# Patient Record
Sex: Female | Born: 1965 | Race: White | Hispanic: No | Smoking: Current every day smoker
Health system: Southern US, Community
[De-identification: ages and names within clinical notes are randomized; demographics above are authoritative.]

## PROBLEM LIST (undated history)

## (undated) DIAGNOSIS — F319 Bipolar disorder, unspecified: Secondary | ICD-10-CM

## (undated) DIAGNOSIS — I10 Essential (primary) hypertension: Secondary | ICD-10-CM

## (undated) DIAGNOSIS — E785 Hyperlipidemia, unspecified: Secondary | ICD-10-CM

## (undated) DIAGNOSIS — G4730 Sleep apnea, unspecified: Secondary | ICD-10-CM

## (undated) DIAGNOSIS — K219 Gastro-esophageal reflux disease without esophagitis: Secondary | ICD-10-CM

## (undated) HISTORY — PX: RECONSTRUCTION OF NOSE: SHX2301

## (undated) HISTORY — DX: Sleep apnea, unspecified: G47.30

## (undated) HISTORY — DX: Gastro-esophageal reflux disease without esophagitis: K21.9

## (undated) HISTORY — DX: Hyperlipidemia, unspecified: E78.5

## (undated) HISTORY — DX: Bipolar disorder, unspecified: F31.9

## (undated) HISTORY — PX: REDUCTION MAMMAPLASTY: SUR839

## (undated) HISTORY — DX: Essential (primary) hypertension: I10

## (undated) HISTORY — PX: BREAST REDUCTION SURGERY: SHX8

---

## 2015-11-27 DIAGNOSIS — F0781 Postconcussional syndrome: Secondary | ICD-10-CM | POA: Insufficient documentation

## 2015-11-27 DIAGNOSIS — F17200 Nicotine dependence, unspecified, uncomplicated: Secondary | ICD-10-CM | POA: Insufficient documentation

## 2015-11-28 DIAGNOSIS — E559 Vitamin D deficiency, unspecified: Secondary | ICD-10-CM | POA: Insufficient documentation

## 2016-01-15 DIAGNOSIS — F3176 Bipolar disorder, in full remission, most recent episode depressed: Secondary | ICD-10-CM | POA: Insufficient documentation

## 2020-10-16 DIAGNOSIS — Z0100 Encounter for examination of eyes and vision without abnormal findings: Secondary | ICD-10-CM | POA: Diagnosis not present

## 2020-10-16 DIAGNOSIS — Z135 Encounter for screening for eye and ear disorders: Secondary | ICD-10-CM | POA: Diagnosis not present

## 2020-10-16 DIAGNOSIS — H5210 Myopia, unspecified eye: Secondary | ICD-10-CM | POA: Diagnosis not present

## 2020-11-19 DIAGNOSIS — H1033 Unspecified acute conjunctivitis, bilateral: Secondary | ICD-10-CM | POA: Diagnosis not present

## 2021-02-19 DIAGNOSIS — Z20822 Contact with and (suspected) exposure to covid-19: Secondary | ICD-10-CM | POA: Diagnosis not present

## 2021-03-05 DIAGNOSIS — R1084 Generalized abdominal pain: Secondary | ICD-10-CM | POA: Diagnosis not present

## 2021-03-05 DIAGNOSIS — K591 Functional diarrhea: Secondary | ICD-10-CM | POA: Diagnosis not present

## 2021-08-12 ENCOUNTER — Other Ambulatory Visit: Payer: Self-pay

## 2021-08-13 ENCOUNTER — Encounter: Payer: Self-pay | Admitting: Family Medicine

## 2021-08-13 ENCOUNTER — Other Ambulatory Visit (HOSPITAL_COMMUNITY)
Admission: RE | Admit: 2021-08-13 | Discharge: 2021-08-13 | Disposition: A | Discharge status: Home / Self Care | Payer: Medicare HMO | Source: Ambulatory Visit | Attending: Diagnostic Radiology | Admitting: Diagnostic Radiology

## 2021-08-13 ENCOUNTER — Ambulatory Visit (INDEPENDENT_AMBULATORY_CARE_PROVIDER_SITE_OTHER): Payer: Medicare HMO | Admitting: Family Medicine

## 2021-08-13 VITALS — BP 132/78 | HR 103 | Temp 97.1°F | Ht 63.25 in | Wt 148.2 lb

## 2021-08-13 DIAGNOSIS — Z01419 Encounter for gynecological examination (general) (routine) without abnormal findings: Secondary | ICD-10-CM | POA: Insufficient documentation

## 2021-08-13 DIAGNOSIS — Z1159 Encounter for screening for other viral diseases: Secondary | ICD-10-CM

## 2021-08-13 DIAGNOSIS — Z1211 Encounter for screening for malignant neoplasm of colon: Secondary | ICD-10-CM | POA: Diagnosis not present

## 2021-08-13 DIAGNOSIS — Z23 Encounter for immunization: Secondary | ICD-10-CM

## 2021-08-13 DIAGNOSIS — E559 Vitamin D deficiency, unspecified: Secondary | ICD-10-CM | POA: Diagnosis not present

## 2021-08-13 DIAGNOSIS — F3176 Bipolar disorder, in full remission, most recent episode depressed: Secondary | ICD-10-CM | POA: Diagnosis not present

## 2021-08-13 DIAGNOSIS — Z124 Encounter for screening for malignant neoplasm of cervix: Secondary | ICD-10-CM | POA: Insufficient documentation

## 2021-08-13 DIAGNOSIS — F17210 Nicotine dependence, cigarettes, uncomplicated: Secondary | ICD-10-CM | POA: Diagnosis not present

## 2021-08-13 DIAGNOSIS — Z114 Encounter for screening for human immunodeficiency virus [HIV]: Secondary | ICD-10-CM

## 2021-08-13 DIAGNOSIS — Z1151 Encounter for screening for human papillomavirus (HPV): Secondary | ICD-10-CM | POA: Diagnosis not present

## 2021-08-13 DIAGNOSIS — Z1231 Encounter for screening mammogram for malignant neoplasm of breast: Secondary | ICD-10-CM

## 2021-08-13 DIAGNOSIS — Z13220 Encounter for screening for lipoid disorders: Secondary | ICD-10-CM

## 2021-08-13 DIAGNOSIS — Z131 Encounter for screening for diabetes mellitus: Secondary | ICD-10-CM

## 2021-08-13 DIAGNOSIS — Z122 Encounter for screening for malignant neoplasm of respiratory organs: Secondary | ICD-10-CM

## 2021-08-13 DIAGNOSIS — R69 Illness, unspecified: Secondary | ICD-10-CM | POA: Diagnosis not present

## 2021-08-13 NOTE — Progress Notes (Signed)
[facility] [address] [place] [address] Dept: [phone] Dept Fax: [phone]  New Patient Office Visit  Subjective:    Patient ID: Ashley [place]ing, female    DOB: April 03, 1966, 56 y.o..   MRN: [id number]  Chief Complaint  Patient presents with   Establish Care    NP- establish care.  No concerns.         History of Present Illness:  Patient is in today to establish care. [place] was born [place]. She attended college at [employer] She moved to [place] about 5 years ago. She has a long-term significant other (20 year relationship). He currently has metastatic rectal cancer. She has a daughter (age 79) from a  previous relationship who lives in [place]. 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 She denies current drug use. She drinks about 5 glasses of wine a week.   Ashley [place]ing started smoking cigarettes in her teens. She quit around age 3, having always smoked less than a pack a day. However, at age 22 she started back smoking and for the past 10 years has been at a pack per day. She has tried to quit at various times in the past, but feels her bipolar disease is her main barrier to quitting.  Ashley [place]ing has a history of Bipolar 1 disease. She is managed by Dr. Toy Care (psychiatry). She is currently on risperidone and sertraline. She notes that she has had multiple concussions in her life. She is also thought to have some overlapping issues with postconcussive syndrome (TBI).  Ashley [place]ing notes she has had past identification of Vitamin D deficiency and hyperlipidemia, though neither is treated currently.  Past Medical History: Patient Active Problem List   Diagnosis Date Noted   Bipolar disorder, in full remission, most recent episode depressed ([place]) 01/15/2016   Vitamin D deficiency 11/28/2015   Postconcussion syndrome 11/27/2015   Nicotine dependence, unspecified,  uncomplicated 51/76/1607   Past Surgical History:  Procedure Laterality Date   BREAST REDUCTION SURGERY Bilateral    RECONSTRUCTION OF NOSE     Family History  Problem Relation Age of Onset   Hyperlipidemia Mother    Hypertension Mother    Stroke Mother    Cancer Father        Skin   Hypertension Father    Hyperlipidemia Father    Stroke Maternal Grandmother    Cancer Maternal Grandfather        Stomach   Heart disease Paternal Grandfather    Outpatient Medications Prior to Visit  Medication Sig Dispense Refill   risperiDONE (RISPERDAL) 1 MG tablet Take 1 tablet by mouth 2 (two) times daily.     sertraline (ZOLOFT) 50 MG tablet Take 50 mg by mouth daily.     No facility-administered medications prior to visit.   No Known Allergies    Objective:   Today's Vitals   08/13/21 1406  BP: 132/78  Pulse: (!) 103  Temp: (!) 97.1 F (36.2 C)  TempSrc: Temporal  SpO2: 96%  Weight: 148 lb 3.2 oz (67.2 kg)  Height: 5' 3.25" (1.607 m)   Body mass index is 26.05 kg/m.   General: Well developed, well nourished. No acute distress. GU: Normal external genitalia. Vaginal vault is pink without discharge. The lower half of the cervix shows   some dilated surface vessels. No CMT. Uterus normal size. No adnexal masses. No anterior or posterior   wall prolapse.  Psych: Alert and oriented x3. Normal mood and affect.  Health Maintenance Due  Topic Date Due   Pneumococcal Vaccine 5-76 Years old (1 - PCV) Never done   Hepatitis C Screening  Never done   TETANUS/TDAP  Never done   PAP SMEAR-Modifier  Never done   COLONOSCOPY (Pts 45-48yrs Insurance coverage will need to be confirmed)  Never done   MAMMOGRAM  Never done   Zoster Vaccines- Shingrix (1 of 2) Never done     Assessment & Plan:   1. Bipolar disorder, in full remission, most recent episode depressed ([place]) Stable on risperidone and  sertraline. She will continue to follow with Dr. Toy Care.  2. Vitamin D deficiency In  light of past history, will reassess Vit. D level.  - VITAMIN D 25 Hydroxy (Vit-D Deficiency, Fractures); Future  3. Cigarette nicotine dependence without complication I recommend smoking cessation. Patient not currently ready to attempt this.  4. Screening for diabetes mellitus (DM)  - Hemoglobin A1c; Future - Basic metabolic panel; Future  5. Screening for lipid disorders  - Lipid panel; Future  6. Encounter for hepatitis C screening test for low risk patient  - HCV Ab w Reflex to Quant PCR; Future  7. Screening for HIV (human immunodeficiency virus)  - HIV Antibody (routine testing w rflx); Future  8. Screening for colon cancer  - Ambulatory referral to Gastroenterology  9. Encounter for screening mammogram for malignant neoplasm of breast  - MM DIGITAL SCREENING BILATERAL; Future  10. Screening for cervical cancer Pap completed today.  - Cytology - PAP  11. Screening for lung cancer Patient has ~ 20 pack-yr. history of smoking. We discussed recommendations for lung cancer screening.  - CT CHEST LUNG CA SCREEN LOW DOSE W/O CM; Future  Haydee Salter, MD

## 2021-08-15 ENCOUNTER — Other Ambulatory Visit (INDEPENDENT_AMBULATORY_CARE_PROVIDER_SITE_OTHER): Payer: Medicare HMO

## 2021-08-15 ENCOUNTER — Other Ambulatory Visit: Payer: Self-pay

## 2021-08-15 ENCOUNTER — Encounter: Payer: Self-pay | Admitting: Family Medicine

## 2021-08-15 DIAGNOSIS — Z131 Encounter for screening for diabetes mellitus: Secondary | ICD-10-CM | POA: Diagnosis not present

## 2021-08-15 DIAGNOSIS — Z1159 Encounter for screening for other viral diseases: Secondary | ICD-10-CM

## 2021-08-15 DIAGNOSIS — Z13220 Encounter for screening for lipoid disorders: Secondary | ICD-10-CM

## 2021-08-15 DIAGNOSIS — R69 Illness, unspecified: Secondary | ICD-10-CM | POA: Diagnosis not present

## 2021-08-15 DIAGNOSIS — E782 Mixed hyperlipidemia: Secondary | ICD-10-CM

## 2021-08-15 DIAGNOSIS — E785 Hyperlipidemia, unspecified: Secondary | ICD-10-CM | POA: Insufficient documentation

## 2021-08-15 DIAGNOSIS — Z114 Encounter for screening for human immunodeficiency virus [HIV]: Secondary | ICD-10-CM

## 2021-08-15 DIAGNOSIS — E559 Vitamin D deficiency, unspecified: Secondary | ICD-10-CM | POA: Diagnosis not present

## 2021-08-15 LAB — VITAMIN D 25 HYDROXY (VIT D DEFICIENCY, FRACTURES): VITD: 17.28 ng/mL — ABNORMAL LOW (ref 30.00–100.00)

## 2021-08-15 LAB — LDL CHOLESTEROL, DIRECT: Direct LDL: 191 mg/dL

## 2021-08-15 LAB — CYTOLOGY - PAP
Adequacy: ABSENT
Comment: NEGATIVE
Diagnosis: NEGATIVE
High risk HPV: NEGATIVE

## 2021-08-15 LAB — BASIC METABOLIC PANEL
BUN: 15 mg/dL (ref 6–23)
CO2: 28 mEq/L (ref 19–32)
Calcium: 9.1 mg/dL (ref 8.4–10.5)
Chloride: 104 mEq/L (ref 96–112)
Creatinine, Ser: 0.85 mg/dL (ref 0.40–1.20)
GFR: 77.18 mL/min (ref >60.00)
Glucose, Bld: 82 mg/dL (ref 70–99)
Potassium: 4.8 mEq/L (ref 3.5–5.1)
Sodium: 140 mEq/L (ref 135–145)

## 2021-08-15 LAB — LIPID PANEL
Cholesterol: 283 mg/dL — ABNORMAL HIGH (ref 0–200)
HDL: 56.4 mg/dL (ref >39.00)
NonHDL: 226.81
Total CHOL/HDL Ratio: 5
Triglycerides: 339 mg/dL — ABNORMAL HIGH (ref 0.0–149.0)
VLDL: 67.8 mg/dL — ABNORMAL HIGH (ref 0.0–40.0)

## 2021-08-15 LAB — HEMOGLOBIN A1C: Hgb A1c MFr Bld: 5.7 % (ref 4.6–6.5)

## 2021-08-16 LAB — HCV INTERPRETATION

## 2021-08-16 LAB — HCV AB W REFLEX TO QUANT PCR: HCV Ab: <0.1 s/co ratio (ref 0.0–0.9)

## 2021-08-18 LAB — HIV ANTIBODY (ROUTINE TESTING W REFLEX): HIV 1&2 Ab, 4th Generation: NONREACTIVE

## 2021-08-18 MED ORDER — ATORVASTATIN CALCIUM 20 MG PO TABS: 20.0000 mg | ORAL_TABLET | Freq: Every day | ORAL | 3 refills | Status: DC

## 2021-08-18 NOTE — Addendum Note (Signed)
Addended by: Haydee Salter on: 08/18/2021 05:18 PM   Modules accepted: Orders

## 2021-08-21 ENCOUNTER — Telehealth: Payer: Self-pay | Admitting: Family Medicine

## 2021-08-21 DIAGNOSIS — E559 Vitamin D deficiency, unspecified: Secondary | ICD-10-CM

## 2021-08-21 MED ORDER — VITAMIN D (ERGOCALCIFEROL) 1.25 MG (50000 UNIT) PO CAPS: 50000.0000 [IU] | ORAL_CAPSULE | ORAL | 1 refills | Status: DC

## 2021-08-21 NOTE — Telephone Encounter (Signed)
Please review and advise. Thanks. Dm/cma  

## 2021-08-21 NOTE — Telephone Encounter (Signed)
error 

## 2021-08-21 NOTE — Telephone Encounter (Signed)
Pt called saying [facility] does not have record of receiving the Rx request for the Vit D. Please resend.

## 2021-08-22 NOTE — Telephone Encounter (Signed)
Lft detailed VM that RX was sent  to pharmacy.  Dm/cma

## 2021-12-18 ENCOUNTER — Encounter: Payer: Self-pay | Admitting: Gastroenterology

## 2021-12-23 ENCOUNTER — Ambulatory Visit
Admission: RE | Admit: 2021-12-23 | Discharge: 2021-12-23 | Disposition: A | Discharge status: Home / Self Care | Payer: Medicare HMO | Source: Ambulatory Visit | Attending: Family Medicine | Admitting: Family Medicine

## 2021-12-23 DIAGNOSIS — Z1231 Encounter for screening mammogram for malignant neoplasm of breast: Secondary | ICD-10-CM

## 2021-12-25 ENCOUNTER — Other Ambulatory Visit: Payer: Self-pay | Admitting: Family Medicine

## 2021-12-25 DIAGNOSIS — R928 Other abnormal and inconclusive findings on diagnostic imaging of breast: Secondary | ICD-10-CM

## 2022-01-09 ENCOUNTER — Ambulatory Visit (AMBULATORY_SURGERY_CENTER): Payer: Medicare HMO | Admitting: *Deleted

## 2022-01-09 VITALS — Ht 62.0 in | Wt 144.0 lb

## 2022-01-09 DIAGNOSIS — Z1211 Encounter for screening for malignant neoplasm of colon: Secondary | ICD-10-CM

## 2022-01-09 NOTE — Progress Notes (Signed)
No egg or soy allergy known to patient  No issues known to pt with past sedation with any surgeries or procedures Patient denies ever being told they had issues or difficulty with intubation  No FH of Malignant Hyperthermia Pt is not on diet pills Pt is not on  home 02  Pt is not on blood thinners  Pt denies issues with constipation  No A fib or A flutter   PV completed in person. Pt verified name, DOB, address and insurance during PV today.  Pt given instruction packet with  consent form to read and sign after procedure and instructions explained and questions answered. Pt encouraged to call with questions or issues.  If pt has My chart, procedure instructions sent via My Chart

## 2022-01-12 ENCOUNTER — Ambulatory Visit
Admission: RE | Admit: 2022-01-12 | Discharge: 2022-01-12 | Disposition: A | Discharge status: Home / Self Care | Payer: Medicare HMO | Source: Ambulatory Visit | Attending: Family Medicine | Admitting: Family Medicine

## 2022-01-12 DIAGNOSIS — F17210 Nicotine dependence, cigarettes, uncomplicated: Secondary | ICD-10-CM | POA: Diagnosis not present

## 2022-01-12 DIAGNOSIS — R69 Illness, unspecified: Secondary | ICD-10-CM | POA: Diagnosis not present

## 2022-01-12 DIAGNOSIS — Z122 Encounter for screening for malignant neoplasm of respiratory organs: Secondary | ICD-10-CM

## 2022-01-14 ENCOUNTER — Encounter: Payer: Self-pay | Admitting: Family Medicine

## 2022-01-14 DIAGNOSIS — I2510 Atherosclerotic heart disease of native coronary artery without angina pectoris: Secondary | ICD-10-CM | POA: Insufficient documentation

## 2022-01-14 DIAGNOSIS — J439 Emphysema, unspecified: Secondary | ICD-10-CM | POA: Insufficient documentation

## 2022-01-14 DIAGNOSIS — I700 Atherosclerosis of aorta: Secondary | ICD-10-CM | POA: Insufficient documentation

## 2022-01-16 ENCOUNTER — Ambulatory Visit: Payer: Medicare HMO

## 2022-01-16 ENCOUNTER — Ambulatory Visit
Admission: RE | Admit: 2022-01-16 | Discharge: 2022-01-16 | Disposition: A | Discharge status: Home / Self Care | Payer: Medicare HMO | Source: Ambulatory Visit | Attending: Family Medicine | Admitting: Family Medicine

## 2022-01-16 DIAGNOSIS — R922 Inconclusive mammogram: Secondary | ICD-10-CM | POA: Diagnosis not present

## 2022-01-16 DIAGNOSIS — R928 Other abnormal and inconclusive findings on diagnostic imaging of breast: Secondary | ICD-10-CM

## 2022-01-29 ENCOUNTER — Encounter: Payer: Self-pay | Admitting: Gastroenterology

## 2022-02-02 DIAGNOSIS — Z0100 Encounter for examination of eyes and vision without abnormal findings: Secondary | ICD-10-CM | POA: Diagnosis not present

## 2022-02-02 DIAGNOSIS — H5210 Myopia, unspecified eye: Secondary | ICD-10-CM | POA: Diagnosis not present

## 2022-02-06 ENCOUNTER — Ambulatory Visit (AMBULATORY_SURGERY_CENTER): Payer: Medicare HMO | Admitting: Gastroenterology

## 2022-02-06 ENCOUNTER — Encounter: Payer: Self-pay | Admitting: Gastroenterology

## 2022-02-06 VITALS — BP 137/77 | HR 78 | Temp 97.3°F | Resp 16 | Ht 63.25 in | Wt 144.0 lb

## 2022-02-06 DIAGNOSIS — K635 Polyp of colon: Secondary | ICD-10-CM

## 2022-02-06 DIAGNOSIS — D123 Benign neoplasm of transverse colon: Secondary | ICD-10-CM | POA: Diagnosis not present

## 2022-02-06 DIAGNOSIS — Z1211 Encounter for screening for malignant neoplasm of colon: Secondary | ICD-10-CM | POA: Diagnosis not present

## 2022-02-06 DIAGNOSIS — D125 Benign neoplasm of sigmoid colon: Secondary | ICD-10-CM

## 2022-02-06 DIAGNOSIS — D122 Benign neoplasm of ascending colon: Secondary | ICD-10-CM

## 2022-02-06 HISTORY — PX: COLONOSCOPY: SHX174

## 2022-02-06 MED ORDER — SODIUM CHLORIDE 0.9 % IV SOLN: 500.0000 mL | Freq: Once | INTRAVENOUS | Status: DC

## 2022-02-06 NOTE — Op Note (Signed)
[facility] Patient Name: Ashley [place] Procedure Date: 02/06/2022 7:16 AM MRN: [id number] Endoscopist: Nicki Reaper E. Candis Schatz , MD Age: 56 Referring MD:  Date of Birth: 06-13-1966 Gender: Female Account #: [id number] Procedure:                Colonoscopy Indications:              Screening for colorectal malignant neoplasm, This                            is the patient's first colonoscopy Medicines:                Monitored Anesthesia Care Procedure:                Pre-Anesthesia Assessment:                           - Prior to the procedure, a History and Physical                            was performed, and patient medications and                            allergies were reviewed. The patient's tolerance of                            previous anesthesia was also reviewed. The risks                            and benefits of the procedure and the sedation                            options and risks were discussed with the patient.                            All questions were answered, and informed consent                            was obtained. Prior Anticoagulants: The patient has                            taken no previous anticoagulant or antiplatelet                            agents. ASA Grade Assessment: II - A patient with                            mild systemic disease. After reviewing the risks                            and benefits, the patient was deemed in                            satisfactory condition to undergo the procedure.  After obtaining informed consent, the colonoscope                            was passed under direct vision. Throughout the                            procedure, the patient's blood pressure, pulse, and                            oxygen saturations were monitored continuously. The                            Olympus CF-HQ190L ([id number]) Colonoscope was                            introduced through the  anus and advanced to the the                            terminal ileum, with identification of the                            appendiceal orifice and IC valve. The colonoscopy                            was performed without difficulty. The patient                            tolerated the procedure well. The quality of the                            bowel preparation was adequate. The terminal ileum,                            ileocecal valve, appendiceal orifice, and rectum                            were photographed. The bowel preparation used was                            Miralax via split dose instruction. Scope In: 7:59:35 AM Scope Out: 8:23:21 AM Scope Withdrawal Time: 0 hours 19 minutes 46 seconds  Total Procedure Duration: 0 hours 23 minutes 46 seconds  Findings:                 The perianal and digital rectal examinations were                            normal.                           A 12 mm polyp was found in the ascending colon. The                            polyp was flat. The polyp was removed with a cold  snare. Resection and retrieval were complete.                            Estimated blood loss was minimal.                           A 6 mm polyp was found in the hepatic flexure. The                            polyp was flat. The polyp was removed with a cold                            snare. Resection and retrieval were complete.                            Estimated blood loss was minimal.                           A 4 mm polyp was found in the sigmoid colon. The                            polyp was sessile. The polyp was removed with a                            cold snare. Resection and retrieval were complete.                            Estimated blood loss was minimal.                           Two sessile polyps were found in the sigmoid colon.                            The polyps were 3 to 6 mm in size. These polyps                             were removed with a cold snare. Resection was                            complete, but only one of the polyps was retrieved.                            Estimated blood loss was minimal.                           Multiple small and large-mouthed diverticula were                            found in the sigmoid colon and descending colon.                           The exam was otherwise normal throughout the  examined colon.                           The terminal ileum appeared normal.                           The retroflexed view of the distal rectum and anal                            verge was normal and showed no anal or rectal                            abnormalities. Complications:            No immediate complications. Estimated Blood Loss:     Estimated blood loss was minimal. Impression:               - One 12 mm polyp in the ascending colon, removed                            with a cold snare. Resected and retrieved.                           - One 6 mm polyp at the hepatic flexure, removed                            with a cold snare. Resected and retrieved.                           - One 4 mm polyp in the sigmoid colon, removed with                            a cold snare. Resected and retrieved.                           - Two 3 to 6 mm polyps in the sigmoid colon,                            removed with a cold snare. Complete resection.                            Partial retrieval (one of the two polyps).                           - Diverticulosis in the sigmoid colon and in the                            descending colon.                           - The examined portion of the ileum was normal.                           - The distal rectum and anal verge are normal on  retroflexion view. Recommendation:           - Patient has a contact number available for                            emergencies. The signs and  symptoms of potential                            delayed complications were discussed with the                            patient. Return to normal activities tomorrow.                            Written discharge instructions were provided to the                            patient.                           - Resume previous diet.                           - Continue present medications.                           - Await pathology results.                           - Repeat colonoscopy (date not yet determined) for                            surveillance based on pathology results. Dickson Kostelnik E. Candis Schatz, MD 02/06/2022 8:33:12 AM This report has been signed electronically.

## 2022-02-06 NOTE — Patient Instructions (Signed)
YOU HAD AN ENDOSCOPIC PROCEDURE TODAY AT THE  ENDOSCOPY CENTER:   Refer to the procedure report that was given to you for any specific questions about what was found during the examination.  If the procedure report does not answer your questions, please call your gastroenterologist to clarify.  If you requested that your care partner not be given the details of your procedure findings, then the procedure report has been included in a sealed envelope for you to review at your convenience later.  YOU SHOULD EXPECT: Some feelings of bloating in the abdomen. Passage of more gas than usual.  Walking can help get rid of the air that was put into your GI tract during the procedure and reduce the bloating. If you had a lower endoscopy (such as a colonoscopy or flexible sigmoidoscopy) you may notice spotting of blood in your stool or on the toilet paper. If you underwent a bowel prep for your procedure, you may not have a normal bowel movement for a few days.  Please Note:  You might notice some irritation and congestion in your nose or some drainage.  This is from the oxygen used during your procedure.  There is no need for concern and it should clear up in a day or so.  SYMPTOMS TO REPORT IMMEDIATELY:  Following lower endoscopy (colonoscopy or flexible sigmoidoscopy):  Excessive amounts of blood in the stool  Significant tenderness or worsening of abdominal pains  Swelling of the abdomen that is new, acute  Fever of 100F or higher  Following upper endoscopy (EGD)  Vomiting of blood or coffee ground material  New chest pain or pain under the shoulder blades  Painful or persistently difficult swallowing  New shortness of breath  Fever of 100F or higher  Black, tarry-looking stools  For urgent or emergent issues, a gastroenterologist can be reached at any hour by calling [phone]. Do not use MyChart messaging for urgent concerns.    DIET:  We do recommend a small meal at first, but  then you may proceed to your regular diet.  Drink plenty of fluids but you should avoid alcoholic beverages for 24 hours.  ACTIVITY:  You should plan to take it easy for the rest of today and you should NOT DRIVE or use heavy machinery until tomorrow (because of the sedation medicines used during the test).    FOLLOW UP: Our staff will call the number listed on your records the next business day following your procedure.  We will call around 7:15- 8:00 am to check on you and address any questions or concerns that you may have regarding the information given to you following your procedure. If we do not reach you, we will leave a message.  If you develop any symptoms (ie: fever, flu-like symptoms, shortness of breath, cough etc.) before then, please call [phone].  If you test positive for Covid 19 in the 2 weeks post procedure, please call and report this information to us.    If any biopsies were taken you will be contacted by phone or by letter within the next 1-3 weeks.  Please call us at [phone] if you have not heard about the biopsies in 3 weeks.    SIGNATURES/CONFIDENTIALITY: You and/or your care partner have signed paperwork which will be entered into your electronic medical record.  These signatures attest to the fact that that the information above on your After Visit Summary has been reviewed and is understood.  Full responsibility of the confidentiality   of this discharge information lies with you and/or your care-partner.  

## 2022-02-06 NOTE — Progress Notes (Signed)
[facility] History and Physical   Primary Care Physician:  Haydee Salter, MD   Reason for Procedure:   Colon cancer screening  Plan:    Screening colonoscopy     HPI: [place] is a 56 y.o. female undergoing initial average risk screening colonoscopy.  She has no family history of colon cancer and no chronic GI symptoms.    Past Medical History:  Diagnosis Date   Bipolar 1 disorder ([place])    GERD (gastroesophageal reflux disease)    Hyperlipidemia    Hypertension     Past Surgical History:  Procedure Laterality Date   BREAST REDUCTION SURGERY Bilateral    COLONOSCOPY  02/06/2022   RECONSTRUCTION OF NOSE     REDUCTION MAMMAPLASTY      Prior to Admission medications   Medication Sig Start Date End Date Taking? Authorizing Provider  atorvastatin (LIPITOR) 20 MG tablet Take 1 tablet (20 mg total) by mouth daily. 08/18/21  Yes Haydee Salter, MD  risperiDONE (RISPERDAL) 1 MG tablet Take 1 tablet by mouth 2 (two) times daily. 03/11/20  Yes [provider]  sertraline (ZOLOFT) 50 MG tablet Take 50 mg by mouth daily. 08/06/21  Yes [provider]  ibuprofen (ADVIL) 800 MG tablet  09/28/21   [provider]    Current Outpatient Medications  Medication Sig Dispense Refill   atorvastatin (LIPITOR) 20 MG tablet Take 1 tablet (20 mg total) by mouth daily. 90 tablet 3   risperiDONE (RISPERDAL) 1 MG tablet Take 1 tablet by mouth 2 (two) times daily.     sertraline (ZOLOFT) 50 MG tablet Take 50 mg by mouth daily.     ibuprofen (ADVIL) 800 MG tablet      Current Facility-Administered Medications  Medication Dose Route Frequency Provider Last Rate Last Admin   0.9 %  sodium chloride infusion  500 mL Intravenous Once Daryel November, MD        Allergies as of 02/06/2022   (No Known Allergies)    Family History  Problem Relation Age of Onset   Hyperlipidemia Mother    Hypertension Mother    Stroke Mother    Cancer Father         Skin   Hypertension Father    Hyperlipidemia Father    Stroke Maternal Grandmother    Stomach cancer Maternal Grandfather    Cancer Maternal Grandfather        Stomach   Heart disease Paternal Grandfather    Colon polyps Neg Hx    Colon cancer Neg Hx    Esophageal cancer Neg Hx    Rectal cancer Neg Hx     Social History   Socioeconomic History   Marital status: Significant Other    Spouse name: Not on file   Number of children: 1   Years of education: Not on file   Highest education level: Not on file  Occupational History   Occupation: Materials engineer    Comment: [employer] [place]  Tobacco Use   Smoking status: Every Day    Packs/day: 1.00    Types: Cigarettes   Smokeless tobacco: Never   Tobacco comments:    Last cigarette around 515 on 02/06/22  Vaping Use   Vaping Use: Never used  Substance and Sexual Activity   Alcohol use: Yes    Comment: wine glass or two on weekends   Drug use: Never   Sexual activity: Yes    Birth control/protection: Post-menopausal  Other Topics Concern  Not on file  Social History Narrative   Not on file   Social Determinants of Health   Financial Resource Strain: Not on file  Food Insecurity: Not on file  Transportation Needs: Not on file  Physical Activity: Not on file  Stress: Not on file  Social Connections: Not on file  Intimate Partner Violence: Not on file    Review of Systems:  All other review of systems negative except as mentioned in the HPI.  Physical Exam: Vital signs BP 136/88   Pulse 96   Temp (!) 97.3 F (36.3 C) (Temporal)   Ht 5' 3.25" (1.607 m)   Wt 144 lb (65.3 kg)   SpO2 96%   BMI 25.31 kg/m   General:   Alert,  Well-developed, well-nourished, pleasant and cooperative in NAD Airway:  Mallampati 2 Lungs:  Clear throughout to auscultation.   Heart:  Regular rate and rhythm; no murmurs, clicks, rubs,  or gallops. Abdomen:  Soft, nontender and nondistended. Normal bowel sounds.   Neuro/Psych:   Normal mood and affect. A and O x 3   Chelesa Weingartner E. Candis Schatz, MD [facility]

## 2022-02-06 NOTE — Progress Notes (Signed)
Sedate, gd SR, tolerated procedure well, VSS, report to RN 

## 2022-02-06 NOTE — Progress Notes (Signed)
Called to room to assist during endoscopic procedure.  Patient ID and intended procedure confirmed with present staff. Received instructions for my participation in the procedure from the performing physician.  

## 2022-02-06 NOTE — Progress Notes (Signed)
Pt's states no medical or surgical changes since previsit or office visit. 

## 2022-02-09 ENCOUNTER — Telehealth: Payer: Self-pay

## 2022-02-09 NOTE — Telephone Encounter (Signed)
No answer, left message to call if having any issues or concerns, B.Inaara Tye RN 

## 2022-02-12 NOTE — Progress Notes (Signed)
Ashley [place],   At least 2 of the polyps that I removed during your recent procedure were completely benign but were proven to be "pre-cancerous" polyps that MAY have grown into cancers if they had not been removed.  One of the polyps was over 10 mm in size.  Studies shows that at least 20% of women over age 56 and 30% of men over age 77 have pre-cancerous polyps.  Based on current nationally recognized surveillance guidelines, I recommend that you have a repeat colonoscopy in 3 years.   If you develop any new rectal bleeding, abdominal pain or significant bowel habit changes, please contact me before then.

## 2022-02-22 ENCOUNTER — Other Ambulatory Visit: Payer: Self-pay | Admitting: Family Medicine

## 2022-02-22 DIAGNOSIS — E559 Vitamin D deficiency, unspecified: Secondary | ICD-10-CM

## 2022-05-05 ENCOUNTER — Encounter: Payer: Self-pay | Admitting: Family Medicine

## 2022-05-05 DIAGNOSIS — B351 Tinea unguium: Secondary | ICD-10-CM

## 2022-05-08 ENCOUNTER — Encounter: Payer: Self-pay | Admitting: Family Medicine

## 2022-05-08 ENCOUNTER — Ambulatory Visit (INDEPENDENT_AMBULATORY_CARE_PROVIDER_SITE_OTHER): Payer: Medicare HMO | Admitting: Family Medicine

## 2022-05-08 VITALS — BP 118/72 | HR 58 | Temp 97.3°F | Ht 63.5 in | Wt 142.4 lb

## 2022-05-08 DIAGNOSIS — Z23 Encounter for immunization: Secondary | ICD-10-CM | POA: Diagnosis not present

## 2022-05-08 DIAGNOSIS — E782 Mixed hyperlipidemia: Secondary | ICD-10-CM | POA: Diagnosis not present

## 2022-05-08 DIAGNOSIS — R69 Illness, unspecified: Secondary | ICD-10-CM | POA: Diagnosis not present

## 2022-05-08 DIAGNOSIS — I2510 Atherosclerotic heart disease of native coronary artery without angina pectoris: Secondary | ICD-10-CM | POA: Diagnosis not present

## 2022-05-08 DIAGNOSIS — F17210 Nicotine dependence, cigarettes, uncomplicated: Secondary | ICD-10-CM

## 2022-05-08 DIAGNOSIS — I700 Atherosclerosis of aorta: Secondary | ICD-10-CM

## 2022-05-08 DIAGNOSIS — M2550 Pain in unspecified joint: Secondary | ICD-10-CM

## 2022-05-08 DIAGNOSIS — J432 Centrilobular emphysema: Secondary | ICD-10-CM

## 2022-05-08 DIAGNOSIS — G8929 Other chronic pain: Secondary | ICD-10-CM

## 2022-05-08 DIAGNOSIS — R519 Headache, unspecified: Secondary | ICD-10-CM

## 2022-05-08 DIAGNOSIS — R12 Heartburn: Secondary | ICD-10-CM

## 2022-05-08 MED ORDER — BUPROPION HCL ER (SR) 150 MG PO TB12: 150.0000 mg | ORAL_TABLET | Freq: Two times a day (BID) | ORAL | 2 refills | Status: DC
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
## 2022-05-12 ENCOUNTER — Other Ambulatory Visit (INDEPENDENT_AMBULATORY_CARE_PROVIDER_SITE_OTHER): Payer: Medicare HMO

## 2022-05-12 DIAGNOSIS — M2550 Pain in unspecified joint: Secondary | ICD-10-CM

## 2022-05-12 DIAGNOSIS — E782 Mixed hyperlipidemia: Secondary | ICD-10-CM | POA: Diagnosis not present

## 2022-05-12 LAB — LIPID PANEL
Cholesterol: 181 mg/dL (ref 0–200)
HDL: 76.9 mg/dL (ref >39.00)
LDL Cholesterol: 66 mg/dL (ref 0–99)
NonHDL: 104.02
Total CHOL/HDL Ratio: 2
Triglycerides: 192 mg/dL — ABNORMAL HIGH (ref 0.0–149.0)
VLDL: 38.4 mg/dL (ref 0.0–40.0)

## 2022-05-12 LAB — C-REACTIVE PROTEIN: CRP: <1 mg/dL (ref 0.5–20.0)

## 2022-05-12 LAB — SEDIMENTATION RATE: Sed Rate: 9 mm/hr (ref 0–30)

## 2022-05-21 ENCOUNTER — Telehealth: Payer: Self-pay | Admitting: Family Medicine

## 2022-05-21 NOTE — Telephone Encounter (Signed)
Left message for patient to call back and schedule Medicare Annual Wellness Visit (AWV).   Please offer to do virtually or by telephone.  Left office number and my jabber [phone].  AWVI eligible as of 01/31/2017  Please schedule at anytime with Nurse Health Advisor.

## 2022-06-11 ENCOUNTER — Other Ambulatory Visit: Payer: Self-pay | Admitting: Family Medicine

## 2022-06-11 DIAGNOSIS — E559 Vitamin D deficiency, unspecified: Secondary | ICD-10-CM

## 2022-06-12 ENCOUNTER — Ambulatory Visit: Payer: Medicare HMO | Admitting: Podiatry

## 2022-06-12 DIAGNOSIS — B351 Tinea unguium: Secondary | ICD-10-CM | POA: Diagnosis not present

## 2022-06-12 MED ORDER — CICLOPIROX 8 % EX SOLN: Freq: Every day | CUTANEOUS | 0 refills | Status: DC

## 2022-06-12 MED ORDER — TERBINAFINE HCL 250 MG PO TABS: 250.0000 mg | ORAL_TABLET | Freq: Every day | ORAL | 2 refills | Status: AC

## 2022-06-12 NOTE — Progress Notes (Signed)
  Subjective:  Patient ID: Ashley Harding, female    DOB: 1965-08-17,  MRN: [id number]  Chief Complaint  Patient presents with   Nail Problem    Nail fungus to bilateral hallux. Has not tried any over the counter medication. Some bleeding when she cuts her nails. Patient is not diabetic.     56 y.o. female presents with concern for bilateral hallux discoloration concern for nail fungal infection.  She is not diabetic has not tried any medications yet for this discoloration.  She says that they are mildly tender at times but not limiting her or bothering her all the time in terms of pain.  Past Medical History:  Diagnosis Date   Bipolar 1 disorder ([place])    GERD (gastroesophageal reflux disease)    Hyperlipidemia    Hypertension     No Known Allergies  ROS: Negative except as per HPI above  Objective:  General: AAO x3, NAD  Dermatological: With attention to the bilateral hallux nails there is thickening yellow-white discoloration and abnormal dystrophic growth.  No significant pain on palpation of either hallux nail.  Remainder of the nails are without significant thickening discoloration or dystrophy.  Vascular:  Dorsalis Pedis artery and Posterior Tibial artery pedal pulses are 2/4 bilateral.  Capillary fill time < 3 sec to all digits.   Neruologic: Grossly intact via light touch bilateral. Protective threshold intact to all sites bilateral.   Musculoskeletal: No gross boney pedal deformities bilateral. No pain, crepitus, or limitation noted with foot and ankle range of motion bilateral. Muscular strength 5/5 in all groups tested bilateral.  Gait: Unassisted, Nonantalgic.   No images are attached to the encounter.  Assessment:   1. Onychomycosis      Plan:  Patient was evaluated and treated and all questions answered.  Onychomycosis -Educated on etiology of nail fungus. -Baseline liver function studies ordered. Will d/c terbinafine if elevated during therapy. -eRx  for oral terbinafine #30. Educated on risks and benefits of the medication. - e Rx for penlac 8% solution applied daily to bilateral hallux nails   Return in about 3 months (around 09/12/2022) for follow up bilateral hallux fungus.          Everitt Amber, DPM [facility]

## 2022-06-13 LAB — HEPATIC FUNCTION PANEL
ALT: 13 IU/L (ref 0–32)
AST: 32 IU/L (ref 0–40)
Albumin: 4.4 g/dL (ref 3.8–4.9)
Alkaline Phosphatase: 97 IU/L (ref 44–121)
Bilirubin Total: 0.5 mg/dL (ref 0.0–1.2)
Bilirubin, Direct: 0.19 mg/dL (ref 0.00–0.40)
Total Protein: 6.9 g/dL (ref 6.0–8.5)

## 2022-07-06 ENCOUNTER — Ambulatory Visit (INDEPENDENT_AMBULATORY_CARE_PROVIDER_SITE_OTHER): Payer: Medicare HMO

## 2022-07-06 VITALS — Ht 62.0 in | Wt 140.0 lb

## 2022-07-06 DIAGNOSIS — Z0000 Encounter for general adult medical examination without abnormal findings: Secondary | ICD-10-CM

## 2022-07-06 NOTE — Patient Instructions (Addendum)
Ashley Harding , Thank you for taking time to come for your Medicare Wellness Visit. I appreciate your ongoing commitment to your health goals. Please review the following plan we discussed and let me know if I can assist you in the future.   These are the goals we discussed:  Goals      Patient Stated     07/06/2022, wants to see ENT for nose and get toes healed        This is a list of the screening recommended for you and due dates:  Health Maintenance  Topic Date Due   Zoster (Shingles) Vaccine (1 of 2) Never done   COVID-19 Vaccine (4 - 2023-24 season) 04/03/2022   Medicare Annual Wellness Visit  07/07/2023   Mammogram  01/17/2024   Pap Smear  08/13/2024   Colon Cancer Screening  02/06/2025   DTaP/Tdap/Td vaccine (2 - Tdap) 08/14/2031   Flu Shot  Completed   Hepatitis C Screening: USPSTF Recommendation to screen - Ages 18-79 yo.  Completed   HIV Screening  Completed   HPV Vaccine  Aged Out    Advanced directives: Advance directive discussed with you today. .  Conditions/risks identified: smoking  Next appointment: Follow up in one year for your annual wellness visit.   Preventive Care 40-64 Years, Female Preventive care refers to lifestyle choices and visits with your health care provider that can promote health and wellness. What does preventive care include? A yearly physical exam. This is also called an annual well check. Dental exams once or twice a year. Routine eye exams. Ask your health care provider how often you should have your eyes checked. Personal lifestyle choices, including: Daily care of your teeth and gums. Regular physical activity. Eating a healthy diet. Avoiding tobacco and drug use. Limiting alcohol use. Practicing safe sex. Taking low-dose aspirin daily starting at age 82. Taking vitamin and mineral supplements as recommended by your health care provider. What happens during an annual well check? The services and screenings done by your health  care provider during your annual well check will depend on your age, overall health, lifestyle risk factors, and family history of disease. Counseling  Your health care provider may ask you questions about your: Alcohol use. Tobacco use. Drug use. Emotional well-being. Home and relationship well-being. Sexual activity. Eating habits. Work and work Statistician. Method of birth control. Menstrual cycle. Pregnancy history. Screening  You may have the following tests or measurements: Height, weight, and BMI. Blood pressure. Lipid and cholesterol levels. These may be checked every 5 years, or more frequently if you are over 12 years old. Skin check. Lung cancer screening. You may have this screening every year starting at age 33 if you have a 30-pack-year history of smoking and currently smoke or have quit within the past 15 years. Fecal occult blood test (FOBT) of the stool. You may have this test every year starting at age 33. Flexible sigmoidoscopy or colonoscopy. You may have a sigmoidoscopy every 5 years or a colonoscopy every 10 years starting at age 42. Hepatitis C blood test. Hepatitis B blood test. Sexually transmitted disease (STD) testing. Diabetes screening. This is done by checking your blood sugar (glucose) after you have not eaten for a while (fasting). You may have this done every 1-3 years. Mammogram. This may be done every 1-2 years. Talk to your health care provider about when you should start having regular mammograms. This may depend on whether you have a family history of breast cancer.  BRCA-related cancer screening. This may be done if you have a family history of breast, ovarian, tubal, or peritoneal cancers. Pelvic exam and Pap test. This may be done every 3 years starting at age 64. Starting at age 18, this may be done every 5 years if you have a Pap test in combination with an HPV test. Bone density scan. This is done to screen for osteoporosis. You may have this  scan if you are at high risk for osteoporosis. Discuss your test results, treatment options, and if necessary, the need for more tests with your health care provider. Vaccines  Your health care provider may recommend certain vaccines, such as: Influenza vaccine. This is recommended every year. Tetanus, diphtheria, and acellular pertussis (Tdap, Td) vaccine. You may need a Td booster every 10 years. Zoster vaccine. You may need this after age 19. Pneumococcal 13-valent conjugate (PCV13) vaccine. You may need this if you have certain conditions and were not previously vaccinated. Pneumococcal polysaccharide (PPSV23) vaccine. You may need one or two doses if you smoke cigarettes or if you have certain conditions. Talk to your health care provider about which screenings and vaccines you need and how often you need them. This information is not intended to replace advice given to you by your health care provider. Make sure you discuss any questions you have with your health care provider. Document Released: 08/16/2015 Document Revised: 04/08/2016 Document Reviewed: 05/21/2015 Elsevier Interactive Patient Education  2017 [employer] Prevention in the Home Falls can cause injuries. They can happen to people of all ages. There are many things you can do to make your home safe and to help prevent falls. What can I do on the outside of my home? Regularly fix the edges of walkways and driveways and fix any cracks. Remove anything that might make you trip as you walk through a door, such as a raised step or threshold. Trim any bushes or trees on the path to your home. Use bright outdoor lighting. Clear any walking paths of anything that might make someone trip, such as rocks or tools. Regularly check to see if handrails are loose or broken. Make sure that both sides of any steps have handrails. Any raised decks and porches should have guardrails on the edges. Have any leaves, snow, or ice  cleared regularly. Use sand or salt on walking paths during winter. Clean up any spills in your garage right away. This includes oil or grease spills. What can I do in the bathroom? Use night lights. Install grab bars by the toilet and in the tub and shower. Do not use towel bars as grab bars. Use non-skid mats or decals in the tub or shower. If you need to sit down in the shower, use a plastic, non-slip stool. Keep the floor dry. Clean up any water that spills on the floor as soon as it happens. Remove soap buildup in the tub or shower regularly. Attach bath mats securely with double-sided non-slip rug tape. Do not have throw rugs and other things on the floor that can make you trip. What can I do in the bedroom? Use night lights. Make sure that you have a light by your bed that is easy to reach. Do not use any sheets or blankets that are too big for your bed. They should not hang down onto the floor. Have a firm chair that has side arms. You can use this for support while you get dressed. Do not have  throw rugs and other things on the floor that can make you trip. What can I do in the kitchen? Clean up any spills right away. Avoid walking on wet floors. Keep items that you use a lot in easy-to-reach places. If you need to reach something above you, use a strong step stool that has a grab bar. Keep electrical cords out of the way. Do not use floor polish or wax that makes floors slippery. If you must use wax, use non-skid floor wax. Do not have throw rugs and other things on the floor that can make you trip. What can I do with my stairs? Do not leave any items on the stairs. Make sure that there are handrails on both sides of the stairs and use them. Fix handrails that are broken or loose. Make sure that handrails are as long as the stairways. Check any carpeting to make sure that it is firmly attached to the stairs. Fix any carpet that is loose or worn. Avoid having throw rugs at the  top or bottom of the stairs. If you do have throw rugs, attach them to the floor with carpet tape. Make sure that you have a light switch at the top of the stairs and the bottom of the stairs. If you do not have them, ask someone to add them for you. What else can I do to help prevent falls? Wear shoes that: Do not have high heels. Have rubber bottoms. Are comfortable and fit you well. Are closed at the toe. Do not wear sandals. If you use a stepladder: Make sure that it is fully opened. Do not climb a closed stepladder. Make sure that both sides of the stepladder are locked into place. Ask someone to hold it for you, if possible. Clearly mark and make sure that you can see: Any grab bars or handrails. First and last steps. Where the edge of each step is. Use tools that help you move around (mobility aids) if they are needed. These include: Canes. Walkers. Scooters. Crutches. Turn on the lights when you go into a dark area. Replace any light bulbs as soon as they burn out. Set up your furniture so you have a clear path. Avoid moving your furniture around. If any of your floors are uneven, fix them. If there are any pets around you, be aware of where they are. Review your medicines with your doctor. Some medicines can make you feel dizzy. This can increase your chance of falling. Ask your doctor what other things that you can do to help prevent falls. This information is not intended to replace advice given to you by your health care provider. Make sure you discuss any questions you have with your health care provider. Document Released: 05/16/2009 Document Revised: 12/26/2015 Document Reviewed: 08/24/2014 Elsevier Interactive Patient Education  2017 [employer].

## 2022-07-06 NOTE — Progress Notes (Signed)
I connected with Ashley Harding today by telephone and verified that I am speaking with the correct person using two identifiers. Location patient: home Location provider: work Persons participating in the virtual visit: Ashley Harding, Ashley Durand LPN.   I discussed the limitations, risks, security and privacy concerns of performing an evaluation and management service by telephone and the availability of in person appointments. I also discussed with the patient that there may be a patient responsible charge related to this service. The patient expressed understanding and verbally consented to this telephonic visit.    Interactive audio and video telecommunications were attempted between this provider and patient, however failed, due to patient having technical difficulties OR patient did not have access to video capability.  We continued and completed visit with audio only.     Vital signs may be patient reported or missing.  Subjective:   Ashley Harding is a 56 y.o. female who presents for an Initial Medicare Annual Wellness Visit.  Review of Systems     Cardiac Risk Factors include: dyslipidemia;smoking/ tobacco exposure     Objective:    Today's Vitals   07/06/22 1256  Weight: 140 lb (63.5 kg)  Height: '5\' 2"'$  (1.575 m)   Body mass index is 25.61 kg/m.     07/06/2022    1:05 PM  Advanced Directives  Does Patient Have a Medical Advance Directive? No    Current Medications (verified) Outpatient Encounter Medications as of 07/06/2022  Medication Sig   atorvastatin (LIPITOR) 20 MG tablet Take 1 tablet (20 mg total) by mouth daily.   ciclopirox (PENLAC) 8 % solution Apply topically at bedtime. Apply over nail and surrounding skin. Apply daily over previous coat. After seven (7) days, may remove with alcohol and continue cycle.   risperiDONE (RISPERDAL) 1 MG tablet Take 1 tablet by mouth 2 (two) times daily.   sertraline (ZOLOFT) 50 MG tablet Take 50 mg by mouth daily.    terbinafine (LAMISIL) 250 MG tablet Take 1 tablet (250 mg total) by mouth daily.   Vitamin D, Ergocalciferol, (DRISDOL) 1.25 MG (50000 UNIT) CAPS capsule TAKE ONE CAPSULE BY MOUTH EVERY WEEK   buPROPion (WELLBUTRIN SR) 150 MG 12 hr tablet Take 1 tablet (150 mg total) by mouth 2 (two) times daily.   No facility-administered encounter medications on file as of 07/06/2022.    Allergies (verified) Patient has no known allergies.   History: Past Medical History:  Diagnosis Date   Bipolar 1 disorder ([place])    GERD (gastroesophageal reflux disease)    Hyperlipidemia    Hypertension    Past Surgical History:  Procedure Laterality Date   BREAST REDUCTION SURGERY Bilateral    COLONOSCOPY  02/06/2022   RECONSTRUCTION OF NOSE     REDUCTION MAMMAPLASTY     Family History  Problem Relation Age of Onset   Hyperlipidemia Mother    Hypertension Mother    Stroke Mother    Cancer Father        Skin   Hypertension Father    Hyperlipidemia Father    Stroke Maternal Grandmother    Stomach cancer Maternal Grandfather    Cancer Maternal Grandfather        Stomach   Heart disease Paternal Grandfather    Colon polyps Neg Hx    Colon cancer Neg Hx    Esophageal cancer Neg Hx    Rectal cancer Neg Hx    Social History   Socioeconomic History   Marital status: Significant Other  Spouse name: Not on file   Number of children: 1   Years of education: Not on file   Highest education level: Not on file  Occupational History   Occupation: Materials engineer    Comment: [employer]- [place]  Tobacco Use   Smoking status: Every Day    Packs/day: 1.00    Types: Cigarettes   Smokeless tobacco: Never   Tobacco comments:    Last cigarette around 515 on 02/06/22  Vaping Use   Vaping Use: Never used  Substance and Sexual Activity   Alcohol use: Yes    Comment: wine glass or two on weekends   Drug use: Never   Sexual activity: Yes    Birth control/protection: Post-menopausal  Other Topics Concern    Not on file  Social History Narrative   Not on file   Social Determinants of Health   Financial Resource Strain: Low Risk  (07/06/2022)   Overall Financial Resource Strain (CARDIA)    Difficulty of Paying Living Expenses: Not hard at all  Food Insecurity: No Food Insecurity (07/06/2022)   Hunger Vital Sign    Worried About Running Out of Food in the Last Year: Never true    Ran Out of Food in the Last Year: Never true  Transportation Needs: No Transportation Needs (07/06/2022)   PRAPARE - Hydrologist (Medical): No    Lack of Transportation (Non-Medical): No  Physical Activity: Sufficiently Active (07/06/2022)   Exercise Vital Sign    Days of Exercise per Week: 4 days    Minutes of Exercise per Session: 150+ min  Stress: Stress Concern Present (07/06/2022)   [place]    Feeling of Stress : To some extent  Social Connections: Not on file    Tobacco Counseling Ready to quit: Yes Counseling given: Not Answered Tobacco comments: Last cigarette around 515 on 02/06/22   Clinical Intake:  Pre-visit preparation completed: Yes  Pain : No/denies pain     Nutritional Status: BMI 25 -29 Overweight Nutritional Risks: None Diabetes: No  How often do you need to have someone help you when you read instructions, pamphlets, or other written materials from your doctor or pharmacy?: 1 - Never  Diabetic? no  Interpreter Needed?: No  Information entered by :: NAllen LPN   Activities of Daily Living    07/06/2022    1:07 PM 07/04/2022   12:15 AM  In your present state of health, do you have any difficulty performing the following activities:  Hearing? 0 0  Vision? 0 1  Difficulty concentrating or making decisions? 0 1  Walking or climbing stairs? 0 0  Dressing or bathing? 0 0  Doing errands, shopping? 0 0  Preparing Food and eating ? N N  Using the Toilet? N N  In the past six months,  have you accidently leaked urine? Y Y  Do you have problems with loss of bowel control? N Y  Managing your Medications? N Y  Comment needs reminder sometimes   Managing your Finances? N Y  Housekeeping or managing your Housekeeping? N Y    Patient Care Team: Haydee Salter, MD as PCP - General (Family Medicine) Nevada Crane, MD as Consulting Physician (Psychiatry)  Indicate any recent Medical Services you may have received from other than [facility] providers in the past year (date may be approximate).     Assessment:   This is a routine wellness examination for Chey.  Hearing/Vision screen Vision Screening - Comments:: Regular eye exams, [facility]  Dietary issues and exercise activities discussed: Current Exercise Habits: The patient has a physically strenuous job, but has no regular exercise apart from work.   Goals Addressed             This Visit's Progress    Patient Stated       07/06/2022, wants to see ENT for nose and get toes healed       Depression Screen    07/06/2022    1:06 PM 08/13/2021    2:55 PM  PHQ 2/9 Scores  PHQ - 2 Score 0 3  PHQ- 9 Score  13    Fall Risk    07/06/2022    1:05 PM 07/04/2022   12:15 AM  Fall Risk   Falls in the past year? 0 0  Number falls in past yr: 0 0  Injury with Fall? 0 1  Risk for fall due to : Medication side effect   Follow up Falls prevention discussed;Education provided;Falls evaluation completed     FALL RISK PREVENTION PERTAINING TO THE HOME:  Any stairs in or around the home? No  If so, are there any without handrails?  N/a Home free of loose throw rugs in walkways, pet beds, electrical cords, etc? Yes  Adequate lighting in your home to reduce risk of falls? Yes   ASSISTIVE DEVICES UTILIZED TO PREVENT FALLS:  Life alert? No  Use of a cane, walker or w/c? No  Grab bars in the bathroom? No  Shower chair or bench in shower? No  Elevated toilet seat or a handicapped toilet? No   TIMED UP AND  GO:  Was the test performed? No .       Cognitive Function:        07/06/2022    1:08 PM  6CIT Screen  What Year? 0 points  What month? 0 points  What time? 0 points  Count back from 20 0 points  Months in reverse 0 points  Repeat phrase 0 points  Total Score 0 points    Immunizations Immunization History  Administered Date(s) Administered   Influenza,inj,Quad PF,6+ Mos 05/08/2022   Influenza-Unspecified 06/03/2021   Moderna Covid-19 Vaccine Bivalent Booster 37yr & up 09/10/2020   Moderna Sars-Covid-2 Vaccination 11/19/2019, 12/17/2019   Td 08/13/2021    TDAP status: Up to date  Flu Vaccine status: Up to date  Pneumococcal vaccine status: Up to date  Covid-19 vaccine status: Completed vaccines  Qualifies for Shingles Vaccine? Yes   Zostavax completed No   Shingrix Completed?: No.    Education has been provided regarding the importance of this vaccine. Patient has been advised to call insurance company to determine out of pocket expense if they have not yet received this vaccine. Advised may also receive vaccine at local pharmacy or Health Dept. Verbalized acceptance and understanding.  Screening Tests Health Maintenance  Topic Date Due   Medicare Annual Wellness (AWV)  Never done   Zoster Vaccines- Shingrix (1 of 2) Never done   COVID-19 Vaccine (4 - 2023-24 season) 04/03/2022   MAMMOGRAM  01/17/2024   PAP SMEAR-Modifier  08/13/2024   COLONOSCOPY (Pts 45-470yrInsurance coverage will need to be confirmed)  02/06/2025   DTaP/Tdap/Td (2 - Tdap) 08/14/2031   INFLUENZA VACCINE  Completed   Hepatitis C Screening  Completed   HIV Screening  Completed   HPV VACCINES  Aged Out    Health Maintenance  Health Maintenance Due  Topic Date Due   Medicare Annual Wellness (AWV)  Never done   Zoster Vaccines- Shingrix (1 of 2) Never done   COVID-19 Vaccine (4 - 2023-24 season) 04/03/2022    Colorectal cancer screening: Type of screening: Colonoscopy. Completed  02/06/2022. Repeat every 3 years  Mammogram status: Completed 01/16/2022. Repeat every year  Bone Density status: n/a  Lung Cancer Screening: (Low Dose CT Chest recommended if Age 78-80 years, 30 pack-year currently smoking OR have quit w/in 15years.) does not qualify.   Lung Cancer Screening Referral: no  Additional Screening:  Hepatitis C Screening: does qualify; Completed 08/15/2021  Vision Screening: Recommended annual ophthalmology exams for early detection of glaucoma and other disorders of the eye. Is the patient up to date with their annual eye exam?  Yes  Who is the provider or what is the name of the office in which the patient attends annual eye exams? [facility] If pt is not established with a provider, would they like to be referred to a provider to establish care? No .   Dental Screening: Recommended annual dental exams for proper oral hygiene  Community Resource Referral / Chronic Care Management: CRR required this visit?  No   CCM required this visit?  No      Plan:     I have personally reviewed and noted the following in the patient's chart:   Medical and social history Use of alcohol, tobacco or illicit drugs  Current medications and supplements including opioid prescriptions. Patient is not currently taking opioid prescriptions. Functional ability and status Nutritional status Physical activity Advanced directives List of other physicians Hospitalizations, surgeries, and ER visits in previous 12 months Vitals Screenings to include cognitive, depression, and falls Referrals and appointments  In addition, I have reviewed and discussed with patient certain preventive protocols, quality metrics, and best practice recommendations. A written personalized care plan for preventive services as well as general preventive health recommendations were provided to patient.     Kellie Simmering, LPN   [id number]   Nurse Notes: none  Due to this being a virtual  visit, the after visit summary with patients personalized plan was offered to patient via mail or my-chart.  Patient would like to access on my-chart

## 2022-07-21 DIAGNOSIS — J3489 Other specified disorders of nose and nasal sinuses: Secondary | ICD-10-CM | POA: Diagnosis not present

## 2022-07-21 DIAGNOSIS — J449 Chronic obstructive pulmonary disease, unspecified: Secondary | ICD-10-CM | POA: Diagnosis not present

## 2022-07-21 DIAGNOSIS — K219 Gastro-esophageal reflux disease without esophagitis: Secondary | ICD-10-CM | POA: Diagnosis not present

## 2022-07-21 DIAGNOSIS — R69 Illness, unspecified: Secondary | ICD-10-CM | POA: Diagnosis not present

## 2022-07-21 DIAGNOSIS — J341 Cyst and mucocele of nose and nasal sinus: Secondary | ICD-10-CM | POA: Diagnosis not present

## 2022-07-21 DIAGNOSIS — G8928 Other chronic postprocedural pain: Secondary | ICD-10-CM | POA: Diagnosis not present

## 2022-07-21 DIAGNOSIS — R0982 Postnasal drip: Secondary | ICD-10-CM | POA: Diagnosis not present

## 2022-07-21 DIAGNOSIS — Z87828 Personal history of other (healed) physical injury and trauma: Secondary | ICD-10-CM | POA: Diagnosis not present

## 2022-07-21 DIAGNOSIS — J0100 Acute maxillary sinusitis, unspecified: Secondary | ICD-10-CM | POA: Diagnosis not present

## 2022-07-21 DIAGNOSIS — J342 Deviated nasal septum: Secondary | ICD-10-CM | POA: Diagnosis not present

## 2022-08-28 ENCOUNTER — Other Ambulatory Visit: Payer: Self-pay | Admitting: Family Medicine

## 2022-08-28 ENCOUNTER — Other Ambulatory Visit: Payer: Self-pay

## 2022-08-28 DIAGNOSIS — E559 Vitamin D deficiency, unspecified: Secondary | ICD-10-CM

## 2022-08-28 DIAGNOSIS — E782 Mixed hyperlipidemia: Secondary | ICD-10-CM

## 2022-08-28 MED ORDER — VITAMIN D (ERGOCALCIFEROL) 1.25 MG (50000 UNIT) PO CAPS: 50000.0000 [IU] | ORAL_CAPSULE | ORAL | 0 refills | Status: DC

## 2022-08-31 ENCOUNTER — Other Ambulatory Visit (INDEPENDENT_AMBULATORY_CARE_PROVIDER_SITE_OTHER): Payer: Medicare HMO | Admitting: Podiatry

## 2022-08-31 ENCOUNTER — Other Ambulatory Visit: Payer: Self-pay | Admitting: Podiatry

## 2022-08-31 ENCOUNTER — Telehealth: Payer: Self-pay | Admitting: Podiatry

## 2022-08-31 DIAGNOSIS — B351 Tinea unguium: Secondary | ICD-10-CM

## 2022-08-31 MED ORDER — TERBINAFINE HCL 250 MG PO TABS: 250.0000 mg | ORAL_TABLET | Freq: Every day | ORAL | 0 refills | Status: DC

## 2022-08-31 MED ORDER — CICLOPIROX 8 % EX SOLN: Freq: Every day | CUTANEOUS | 0 refills | Status: DC

## 2022-08-31 NOTE — Progress Notes (Signed)
Refill of penlac sent per pt request

## 2022-08-31 NOTE — Telephone Encounter (Signed)
Pt need refill of medication. She left her medication in [place] while visiting her mom who had a stroke. Pharmacy has sent over refill request and no response.   terbinafine (LAMISIL) 250 MG tablet   Please advise.  [facility] [address] [place], [place]. AT [place]  [address] [place] [address]

## 2022-08-31 NOTE — Progress Notes (Signed)
Refill of lamisil sent per pt request

## 2022-08-31 NOTE — Telephone Encounter (Signed)
Pt requested wrong refill... She is needing ciclopirox (PENLAC) 8 % solution for her toenails.  Please advise.  [facility] [address] [place], [place]. AT [place]

## 2022-09-11 ENCOUNTER — Ambulatory Visit: Payer: Medicare HMO | Admitting: Podiatry

## 2022-09-18 ENCOUNTER — Ambulatory Visit: Payer: Medicare HMO | Admitting: Podiatry

## 2022-09-18 DIAGNOSIS — B351 Tinea unguium: Secondary | ICD-10-CM

## 2022-09-18 NOTE — Progress Notes (Signed)
  Subjective:  Patient ID: Ashley Harding, female    DOB: November 04, 1965,  MRN: [id number]  Chief Complaint  Patient presents with   Follow-up    follow up bilateral hallux fungus.    57 y.o. female presents for follow-up of bilateral hallux nail fungus.  She has been using topical antifungal Penlac 8% solution applied to the hallux nails.  She has seen improvement in the coloration and decreased thickness thinks nails are thinning out and flattening slightly.  Denies any pain.  She is happy with the results thus far.  She was not taking the oral antifungal terbinafine due to concern for side effects.  Past Medical History:  Diagnosis Date   Bipolar 1 disorder ([place])    GERD (gastroesophageal reflux disease)    Hyperlipidemia    Hypertension     No Known Allergies  ROS: Negative except as per HPI above  Objective:  General: AAO x3, NAD  Dermatological: With attention to the bilateral hallux nails there is decreased yellow-white discoloration and nails are slightly thinner than prior.  No pain on palpation.  Vascular:  Dorsalis Pedis artery and Posterior Tibial artery pedal pulses are 2/4 bilateral.  Capillary fill time < 3 sec to all digits.   Neruologic: Grossly intact via light touch bilateral. Protective threshold intact to all sites bilateral.   Musculoskeletal: No gross boney pedal deformities bilateral. No pain, crepitus, or limitation noted with foot and ankle range of motion bilateral. Muscular strength 5/5 in all groups tested bilateral.  Gait: Unassisted, Nonantalgic.   No images are attached to the encounter.  Assessment:   1. Onychomycosis       Plan:  Patient was evaluated and treated and all questions answered.  Onychomycosis -Educated on etiology of nail fungus. -Improved after topical antifungal patient did not want to take the oral antifungal. -Continue with penlac 8% solution applied daily to bilateral hallux nails -Patient will follow-up in 3 months  for recheck of fungal nails.   Return in about 3 months (around 12/17/2022).          Everitt Amber, DPM [facility]

## 2022-09-28 ENCOUNTER — Encounter: Payer: Self-pay | Admitting: Family Medicine

## 2022-09-30 ENCOUNTER — Ambulatory Visit (INDEPENDENT_AMBULATORY_CARE_PROVIDER_SITE_OTHER): Payer: Medicare HMO | Admitting: Family Medicine

## 2022-09-30 ENCOUNTER — Encounter: Payer: Self-pay | Admitting: Family Medicine

## 2022-09-30 VITALS — BP 124/86 | HR 94 | Temp 98.3°F | Ht 62.0 in | Wt 140.2 lb

## 2022-09-30 DIAGNOSIS — I2510 Atherosclerotic heart disease of native coronary artery without angina pectoris: Secondary | ICD-10-CM | POA: Diagnosis not present

## 2022-09-30 DIAGNOSIS — I700 Atherosclerosis of aorta: Secondary | ICD-10-CM

## 2022-09-30 DIAGNOSIS — R69 Illness, unspecified: Secondary | ICD-10-CM | POA: Diagnosis not present

## 2022-09-30 DIAGNOSIS — R053 Chronic cough: Secondary | ICD-10-CM

## 2022-09-30 DIAGNOSIS — J449 Chronic obstructive pulmonary disease, unspecified: Secondary | ICD-10-CM

## 2022-09-30 DIAGNOSIS — E782 Mixed hyperlipidemia: Secondary | ICD-10-CM | POA: Diagnosis not present

## 2022-09-30 DIAGNOSIS — F17200 Nicotine dependence, unspecified, uncomplicated: Secondary | ICD-10-CM

## 2022-09-30 DIAGNOSIS — R079 Chest pain, unspecified: Secondary | ICD-10-CM

## 2022-09-30 NOTE — Assessment & Plan Note (Signed)
The cough is most likely secondary to chronic bronchitis/COPD, although there could be some element of acid reflux involved. I will refer her to pulmonology to assess PFTS and recommend treatment options.

## 2022-09-30 NOTE — Assessment & Plan Note (Signed)
I strongly recommend that Ashley Harding stop smoking. I suggested she talk to her behavioral health provider regarding medications that might not interfere with her psychiatric meds.

## 2022-09-30 NOTE — Assessment & Plan Note (Signed)
Although the current complaint of chest pain is not clearly anginal, Ashley Harding is at very high-risk for cardiac disease based on CT evidence calcifications of her LAD, aortic atherosclerosis, hyperlipidemia, and her ongoing heavy use of tobacco. I will refer her to cardiology for assessment.

## 2022-09-30 NOTE — Progress Notes (Signed)
[facility] [address] [place] [address] Dept: [phone] Dept Fax: [phone]  Office Visit  Subjective:    Patient ID: Ashley Harding, female    DOB: 08/28/1965, 57 y.o..   MRN: [id number]  Chief Complaint  Patient presents with   Medical Management of Chronic Issues    Wants a cardiology referral, c/o having migraines, chest pains, cough off/on.  Has taken Tums and Ibuprofen.    History of Present Illness:  Patient is in today for with concerns about chest pains and requesting to see a cardiologist. She notes that she has been having chest pain. Although she notes this is intermittent, she means that this waxes and wanes, but is never fully gone. She has occasional pain into both sides of her neck. she has not had arm pain, nausea associated with the chest pain, and has had no diaphoresis.  Ashley Harding also has a history of a persistent cough. She notes that she has coughing spells that do make her feel like she might vomit. She feels she may be having acid reflux that set off her coughing. She wonders if she might need to see a GI doctor to evaluate her stomach. She smokes 1 to 1 1/2 ppd of cigarettes. She admits to cough being worse in the mornings. She had tried bupropion, but this apparently interfered with her risperidone that she takes for her bipolar disorder.  Past Medical History: Patient Active Problem List   Diagnosis Date Noted   Laryngopharyngeal reflux (LPR) 07/21/2022   COPD (chronic obstructive pulmonary disease) ([place]) 07/21/2022   Aortic atherosclerosis ([place]) 01/14/2022   Coronary artery calcification seen on CT scan 01/14/2022   Emphysema lung ([place]) 01/14/2022   Hyperlipidemia 08/15/2021   Bipolar disorder, in full remission, most recent episode depressed ([place]) 01/15/2016   Vitamin D deficiency 11/28/2015   Postconcussion syndrome 11/27/2015   Tobacco use disorder 11/27/2015   Past Surgical  History:  Procedure Laterality Date   BREAST REDUCTION SURGERY Bilateral    COLONOSCOPY  02/06/2022   RECONSTRUCTION OF NOSE     REDUCTION MAMMAPLASTY     Family History  Problem Relation Age of Onset   Hyperlipidemia Mother    Hypertension Mother    Stroke Mother    Cancer Father        Skin   Hypertension Father    Hyperlipidemia Father    Stroke Maternal Grandmother    Stomach cancer Maternal Grandfather    Cancer Maternal Grandfather        Stomach   Heart disease Paternal Grandfather    Colon polyps Neg Hx    Colon cancer Neg Hx    Esophageal cancer Neg Hx    Rectal cancer Neg Hx    Outpatient Medications Prior to Visit  Medication Sig Dispense Refill   atorvastatin (LIPITOR) 20 MG tablet TAKE ONE TABLET BY MOUTH ONE TIME DAILY 90 tablet 1   ciclopirox (PENLAC) 8 % solution Apply topically at bedtime. Apply over nail and surrounding skin. Apply daily over previous coat. After seven (7) days, may remove with alcohol and continue cycle. 6.6 mL 0   risperiDONE (RISPERDAL) 1 MG tablet Take 1 tablet by mouth 2 (two) times daily.     sertraline (ZOLOFT) 50 MG tablet Take 50 mg by mouth daily.     Vitamin D, Ergocalciferol, (DRISDOL) 1.25 MG (50000 UNIT) CAPS capsule Take 1 capsule (50,000 Units total) by mouth once a week. 5 capsule 0  ciclopirox (PENLAC) 8 % solution Apply topically at bedtime. Apply over nail and surrounding skin. Apply daily over previous coat. After seven (7) days, may remove with alcohol and continue cycle. 6.6 mL 0   buPROPion (WELLBUTRIN SR) 150 MG 12 hr tablet Take 1 tablet (150 mg total) by mouth 2 (two) times daily. 60 tablet 2   terbinafine (LAMISIL) 250 MG tablet Take 1 tablet (250 mg total) by mouth daily. 30 tablet 0   No facility-administered medications prior to visit.   No Known Allergies   Objective:   Today's Vitals   09/30/22 1322  BP: 124/86  Pulse: 94  Temp: 98.3 F (36.8 C)  TempSrc: Temporal  SpO2: 95%  Weight: 140 lb 3.2 oz  (63.6 kg)  Height: '5\' 2"'$  (1.575 m)   Body mass index is 25.64 kg/m.   General: Well developed, well nourished. No acute distress. Lungs: Clear to auscultation bilaterally. No wheezing, rales or rhonchi. CV: RRR without murmurs or rubs. Pulses 2+ bilaterally. Abdomen: Soft, non-tender. Bowel sounds positive, normal pitch and frequency. No hepatosplenomegaly. No   rebound or guarding. Psych: Alert and oriented. Normal mood and affect.  Health Maintenance Due  Topic Date Due   Zoster Vaccines- Shingrix (1 of 2) Never done   Imaging: CT Lung Cancer Screen (01/13/2023) IMPRESSION: 1. Lung-RADS 1S, negative. Continue annual screening with low-dose chest CT without contrast in 12 months. 2. The "S" modifier above refers to potentially clinically significant non lung cancer related findings. Specifically, there is aortic atherosclerosis, in addition to left circumflex coronary artery disease. Please note that although the presence of coronary artery calcium documents the presence of coronary artery disease, the severity of this disease and any potential stenosis cannot be assessed on this non-gated CT examination. Assessment for potential risk factor modification, dietary therapy or pharmacologic therapy may be warranted, if clinically indicated. 3. Mild diffuse bronchial wall thickening with very mild centrilobular and paraseptal emphysema; imaging findings suggestive of underlying COPD.  Lab Results Last lipids Lab Results  Component Value Date   CHOL 181 05/12/2022   HDL 76.90 05/12/2022   LDLCALC 66 05/12/2022   LDLDIRECT 191.0 08/15/2021   TRIG 192.0 (H) 05/12/2022   CHOLHDL 2 05/12/2022   Assessment & Plan:   Problem List Items Addressed This Visit       Cardiovascular and Mediastinum   Aortic atherosclerosis ([place])   Relevant Orders   Ambulatory referral to Cardiology   Coronary artery calcification seen on CT scan   Relevant Orders   Ambulatory referral to Cardiology      Respiratory   COPD (chronic obstructive pulmonary disease) ([place])   Relevant Orders   Ambulatory referral to Pulmonology     Other   Tobacco use disorder    I strongly recommend that Ashley Harding stop smoking. I suggested she talk to her behavioral health provider regarding medications that might not interfere with her psychiatric meds.      Relevant Orders   Ambulatory referral to Cardiology   Ambulatory referral to Pulmonology   Hyperlipidemia    Last LDL was < 70. Continue atorvastatin 20 mg daily.      Relevant Orders   Ambulatory referral to Cardiology   Chest pain - Primary    Although the current complaint of chest pain is not clearly anginal, Ms. [place] is at very high-risk for cardiac disease based on CT evidence calcifications of her LAD, aortic atherosclerosis, hyperlipidemia, and her ongoing heavy use of tobacco. I will refer her  to cardiology for assessment.      Relevant Orders   Ambulatory referral to Cardiology   Chronic cough    The cough is most likely secondary to chronic bronchitis/COPD, although there could be some element of acid reflux involved. I will refer her to pulmonology to assess PFTS and recommend treatment options.      Relevant Orders   Ambulatory referral to Pulmonology    Return in about 3 months (around 12/29/2022) for Reassessment.   Haydee Salter, MD

## 2022-09-30 NOTE — Assessment & Plan Note (Signed)
Last LDL was < 70. Continue atorvastatin 20 mg daily.

## 2022-10-01 ENCOUNTER — Other Ambulatory Visit: Payer: Self-pay

## 2022-10-01 DIAGNOSIS — E559 Vitamin D deficiency, unspecified: Secondary | ICD-10-CM

## 2022-10-01 NOTE — Telephone Encounter (Signed)
Refill request received from [facility] for:  Vitamin D2 50000 unit LR 08/28/22, #5, 0 rf LOV 09/30/22 FOV   none scheduled.   Please review and advise. Thanks. Dm/cma

## 2022-10-02 ENCOUNTER — Telehealth: Payer: Self-pay | Admitting: Family Medicine

## 2022-10-02 MED ORDER — VITAMIN D (ERGOCALCIFEROL) 1.25 MG (50000 UNIT) PO CAPS: 50000.0000 [IU] | ORAL_CAPSULE | ORAL | 0 refills | Status: DC

## 2022-10-02 NOTE — Telephone Encounter (Signed)
Form  placed on providers desk to be filled out.    Called patient and explained that she would need an appointment to go over the [facility] form. Scheduled for 10/06/22 @ 2:20 pm. Dm/cma

## 2022-10-02 NOTE — Telephone Encounter (Signed)
Patient dropped off document FMLA, to be filled out by provider. Patient requested to send it via Call Patient to pick up within 5-days. Document is located in providers tray at front office.

## 2022-10-06 ENCOUNTER — Ambulatory Visit (INDEPENDENT_AMBULATORY_CARE_PROVIDER_SITE_OTHER): Payer: Medicare HMO | Admitting: Family Medicine

## 2022-10-06 ENCOUNTER — Encounter: Payer: Self-pay | Admitting: Family Medicine

## 2022-10-06 VITALS — BP 120/72 | HR 93 | Temp 98.3°F | Ht 62.0 in | Wt 137.6 lb

## 2022-10-06 DIAGNOSIS — A0472 Enterocolitis due to Clostridium difficile, not specified as recurrent: Secondary | ICD-10-CM

## 2022-10-06 DIAGNOSIS — R079 Chest pain, unspecified: Secondary | ICD-10-CM

## 2022-10-06 DIAGNOSIS — F17200 Nicotine dependence, unspecified, uncomplicated: Secondary | ICD-10-CM | POA: Diagnosis not present

## 2022-10-06 DIAGNOSIS — R69 Illness, unspecified: Secondary | ICD-10-CM | POA: Diagnosis not present

## 2022-10-06 DIAGNOSIS — R197 Diarrhea, unspecified: Secondary | ICD-10-CM | POA: Diagnosis not present

## 2022-10-06 NOTE — Progress Notes (Signed)
[facility] [address] [place] [address] Dept: [phone] Dept Fax: [phone]  Office Visit  Subjective:    Patient ID: Ashley Harding, female    DOB: 1965-08-19, 57 y.o..   MRN: [id number]  Chief Complaint  Patient presents with   Medical Management of Chronic Issues    F/u to discuss FMLA paperwork.  C/o having loss of appetite and more bowel movements (x6) daily   History of Present Illness:  Patient is in today for discussion of FMLA related to her recent chest pain. Ashley Harding was seen recently to discuss with me about chronic left chest pain that was waxing and waning. She had dropped FMLA paperwork. Apparently, she stopped working around 2/22 due to the constant chest pain. She works as a Chief Strategy Officer at [employer]. She notes that any exertion she makes at work, seems to worsen her pain. She is currently scheduled to see the cardiologist on 3/21. She feels like she is unable to work until then. She currently works 3 days a week, < 21 hrs per week.  Ashley Harding also notes that she has had recent diarrhea issues. This has been going on for the past 2 weeks. this morning she had 6 loose to watery stools. she does not recall eating any food that did not taste like it was fresh. She has not had any recent travel.  Past Medical History: Patient Active Problem List   Diagnosis Date Noted   Chest pain 09/30/2022   Chronic cough 09/30/2022   Laryngopharyngeal reflux (LPR) 07/21/2022   COPD (chronic obstructive pulmonary disease) ([place]) 07/21/2022   Aortic atherosclerosis ([place]) 01/14/2022   Coronary artery calcification seen on CT scan 01/14/2022   Emphysema lung ([place]) 01/14/2022   Hyperlipidemia 08/15/2021   Bipolar disorder, in full remission, most recent episode depressed ([place]) 01/15/2016   Vitamin D deficiency 11/28/2015   Postconcussion syndrome 11/27/2015   Tobacco use disorder 11/27/2015   Past Surgical History:   Procedure Laterality Date   BREAST REDUCTION SURGERY Bilateral    COLONOSCOPY  02/06/2022   RECONSTRUCTION OF NOSE     REDUCTION MAMMAPLASTY     Family History  Problem Relation Age of Onset   Hyperlipidemia Mother    Hypertension Mother    Stroke Mother    Cancer Father        Skin   Hypertension Father    Hyperlipidemia Father    Stroke Maternal Grandmother    Stomach cancer Maternal Grandfather    Cancer Maternal Grandfather        Stomach   Heart disease Paternal Grandfather    Colon polyps Neg Hx    Colon cancer Neg Hx    Esophageal cancer Neg Hx    Rectal cancer Neg Hx    Outpatient Medications Prior to Visit  Medication Sig Dispense Refill   atorvastatin (LIPITOR) 20 MG tablet TAKE ONE TABLET BY MOUTH ONE TIME DAILY 90 tablet 1   ciclopirox (PENLAC) 8 % solution Apply topically at bedtime. Apply over nail and surrounding skin. Apply daily over previous coat. After seven (7) days, may remove with alcohol and continue cycle. 6.6 mL 0   risperiDONE (RISPERDAL) 1 MG tablet Take 1 tablet by mouth 2 (two) times daily.     sertraline (ZOLOFT) 50 MG tablet Take 50 mg by mouth daily.     Vitamin D, Ergocalciferol, (DRISDOL) 1.25 MG (50000 UNIT) CAPS capsule Take 1 capsule (50,000 Units total) by mouth once a  week. 5 capsule 0   No facility-administered medications prior to visit.   No Known Allergies   Objective:   Today's Vitals   10/06/22 1412  BP: 120/72  Pulse: 93  Temp: 98.3 F (36.8 C)  TempSrc: Temporal  SpO2: 96%  Weight: 137 lb 9.6 oz (62.4 kg)  Height: '5\' 2"'$  (1.575 m)   Body mass index is 25.17 kg/m.   General: Well developed, well nourished. No acute distress. Psych: Alert and oriented. Normal mood and affect.  Health Maintenance Due  Topic Date Due   Zoster Vaccines- Shingrix (1 of 2) Never done     Assessment & Plan:   Problem List Items Addressed This Visit       Other   Tobacco use disorder    I reinforced recommendation that Ms.  Ashley Harding stop smoking. She states she is not contemplating quitting. I pointed out the impacts to her health and risks for future complications.      Chest pain - Primary    Cardiology assessment is pending. I completed the FMLA paperwork, though it seems premature as her evaluation is not complete.       Other Visit Diagnoses     Diarrhea, unspecified type       Recommend we do some stool studies to check for common etiologies. I will have her use some Imodium for now to control the diarrhea.   Relevant Orders   Stool Culture   Gastrointestinal Panel by PCR , Stool       Return in about 4 weeks (around 11/03/2022) for Reassessment.   Haydee Salter, MD

## 2022-10-06 NOTE — Patient Instructions (Signed)
Recommend Imodium for diarrhea

## 2022-10-06 NOTE — Assessment & Plan Note (Signed)
Cardiology assessment is pending. I completed the FMLA paperwork, though it seems premature as her evaluation is not complete.

## 2022-10-06 NOTE — Assessment & Plan Note (Signed)
I reinforced recommendation that Ashley Harding stop smoking. She states she is not contemplating quitting. I pointed out the impacts to her health and risks for future complications.

## 2022-10-07 DIAGNOSIS — R197 Diarrhea, unspecified: Secondary | ICD-10-CM | POA: Diagnosis not present

## 2022-10-07 NOTE — Addendum Note (Signed)
Addended by: Beryle Lathe S on: 10/07/2022 11:48 AM   Modules accepted: Orders

## 2022-10-07 NOTE — Telephone Encounter (Signed)
Form filled out and faxed to [facility] @ [phone].  Dm/cma

## 2022-10-07 NOTE — Addendum Note (Signed)
Addended by: Beryle Lathe S on: 10/07/2022 11:49 AM   Modules accepted: Orders

## 2022-10-10 LAB — GI PROFILE, STOOL, PCR

## 2022-10-11 ENCOUNTER — Encounter: Payer: Self-pay | Admitting: Family Medicine

## 2022-10-11 LAB — STOOL CULTURE: E coli, Shiga toxin Assay: NEGATIVE

## 2022-10-12 DIAGNOSIS — A0472 Enterocolitis due to Clostridium difficile, not specified as recurrent: Secondary | ICD-10-CM | POA: Insufficient documentation

## 2022-10-12 MED ORDER — VANCOMYCIN HCL 125 MG PO CAPS: 125.0000 mg | ORAL_CAPSULE | Freq: Four times a day (QID) | ORAL | 0 refills | Status: AC

## 2022-10-12 NOTE — Addendum Note (Signed)
Addended by: Haydee Salter on: 10/12/2022 08:08 AM   Modules accepted: Orders

## 2022-10-20 NOTE — Telephone Encounter (Signed)
Spoke to patient and scheduled an appointment for 10/23/22.  Dm/cma

## 2022-10-21 ENCOUNTER — Ambulatory Visit: Payer: Medicare HMO | Admitting: Pulmonary Disease

## 2022-10-21 ENCOUNTER — Encounter: Payer: Self-pay | Admitting: Pulmonary Disease

## 2022-10-21 ENCOUNTER — Ambulatory Visit: Payer: Medicare HMO | Attending: Internal Medicine | Admitting: Internal Medicine

## 2022-10-21 VITALS — BP 122/84 | HR 82 | Ht 62.0 in | Wt 142.0 lb

## 2022-10-21 VITALS — BP 126/74 | HR 101 | Ht 62.0 in | Wt 142.2 lb

## 2022-10-21 DIAGNOSIS — J432 Centrilobular emphysema: Secondary | ICD-10-CM

## 2022-10-21 DIAGNOSIS — R0982 Postnasal drip: Secondary | ICD-10-CM

## 2022-10-21 DIAGNOSIS — R072 Precordial pain: Secondary | ICD-10-CM | POA: Diagnosis not present

## 2022-10-21 DIAGNOSIS — R053 Chronic cough: Secondary | ICD-10-CM

## 2022-10-21 DIAGNOSIS — R0683 Snoring: Secondary | ICD-10-CM | POA: Diagnosis not present

## 2022-10-21 DIAGNOSIS — R69 Illness, unspecified: Secondary | ICD-10-CM | POA: Diagnosis not present

## 2022-10-21 DIAGNOSIS — Z01812 Encounter for preprocedural laboratory examination: Secondary | ICD-10-CM

## 2022-10-21 DIAGNOSIS — F17210 Nicotine dependence, cigarettes, uncomplicated: Secondary | ICD-10-CM

## 2022-10-21 DIAGNOSIS — K219 Gastro-esophageal reflux disease without esophagitis: Secondary | ICD-10-CM

## 2022-10-21 MED ORDER — FLUTICASONE PROPIONATE 50 MCG/ACT NA SUSP: 1.0000 | Freq: Every day | NASAL | 2 refills | Status: AC

## 2022-10-21 MED ORDER — FAMOTIDINE 20 MG PO TABS: 20.0000 mg | ORAL_TABLET | Freq: Every day | ORAL | 2 refills | Status: AC

## 2022-10-21 MED ORDER — FLUTICASONE-SALMETEROL 250-50 MCG/ACT IN AEPB: 1.0000 | INHALATION_SPRAY | Freq: Two times a day (BID) | RESPIRATORY_TRACT | 2 refills | Status: AC

## 2022-10-21 MED ORDER — METOPROLOL TARTRATE 100 MG PO TABS: ORAL_TABLET | ORAL | 0 refills | Status: DC

## 2022-10-21 NOTE — Progress Notes (Signed)
Synopsis: Referred in March 2024 for COPD by Arlester Marker, MD  Subjective:   PATIENT ID: Ashley Harding GENDER: female DOB: 07-20-1966, MRN: [id number]  HPI  Chief Complaint  Patient presents with   Consult    Referred by PCP for history of COPD. Denies any SOB but she has had chest discomfort for the past 2 months. Believes it might be acid reflux. Productive cough with clear phlegm.    Ashley Harding is a 57 year old woman, daily smoker with history of bipolar disorder, GERD, HLD and hypertension who is referred to pulmonary clinic for evaluation of COPD.   She was seen by her PCP 10/06/22, note reviewed, where she complained of chronic left chest pain that waxes/wanes and worsens with exertion while at work. She also has persistent cough which prompted referral to our office. The chest pain is dull. She thought was related to heart burn and has been taking TUMs as needed which has helped. She doesn't think she has shortness of breath. Denies wheezing. She has cough with mucous production. She has sinus congestion and post-nasal drainage. She has seasonal allergies. She will take benedryl as needed. She does not use nasal sprays.   She reports history of snoring. She had a colonoscopy last summer and anesthesiologist was concerned for sleep apnea.   She is enrolled in lung cancer screening, last scan 01/2022 without suspicious nodules. Mild diffuse bronchial wall thickening and very mild centrilobular and paraseptal emphysema. Mild scarring in the medial segment of RML and medial aspect of lingula.   She started smoking in college and then quit at age [age] years. She has been smoking close to 20 years 1-2 packs per day. She works as a Chief Strategy Officer. No family history of lung disease.   Past Medical History:  Diagnosis Date   Bipolar 1 disorder ([place])    GERD (gastroesophageal reflux disease)    Hyperlipidemia    Hypertension      Family History  Problem Relation Age of Onset    Hyperlipidemia Mother    Hypertension Mother    Stroke Mother    Cancer Father        Skin   Hypertension Father    Hyperlipidemia Father    Stroke Maternal Grandmother    Stomach cancer Maternal Grandfather    Cancer Maternal Grandfather        Stomach   Heart disease Paternal Grandfather    Colon polyps Neg Hx    Colon cancer Neg Hx    Esophageal cancer Neg Hx    Rectal cancer Neg Hx      Social History   Socioeconomic History   Marital status: Significant Other    Spouse name: Not on file   Number of children: 1   Years of education: Not on file   Highest education level: Not on file  Occupational History   Occupation: Materials engineer    Comment: [employer] [place]  Tobacco Use   Smoking status: Every Day    Packs/day: 1.5    Types: Cigarettes   Smokeless tobacco: Never  Vaping Use   Vaping Use: Never used  Substance and Sexual Activity   Alcohol use: Yes    Comment: wine glass or two nightly   Drug use: Never   Sexual activity: Yes    Birth control/protection: Post-menopausal  Other Topics Concern   Not on file  Social History Narrative   Not on file   Social Determinants of Health  Financial Resource Strain: Low Risk  (07/06/2022)   Overall Financial Resource Strain (CARDIA)    Difficulty of Paying Living Expenses: Not hard at all  Food Insecurity: No Food Insecurity (07/06/2022)   Hunger Vital Sign    Worried About Running Out of Food in the Last Year: Never true    Ran Out of Food in the Last Year: Never true  Transportation Needs: No Transportation Needs (07/06/2022)   PRAPARE - Hydrologist (Medical): No    Lack of Transportation (Non-Medical): No  Physical Activity: Sufficiently Active (07/06/2022)   Exercise Vital Sign    Days of Exercise per Week: 4 days    Minutes of Exercise per Session: 150+ min  Stress: Stress Concern Present (07/06/2022)   [place]     Feeling of Stress : To some extent  Social Connections: Not on file  Intimate Partner Violence: Not on file     No Known Allergies   Outpatient Medications Prior to Visit  Medication Sig Dispense Refill   atorvastatin (LIPITOR) 20 MG tablet TAKE ONE TABLET BY MOUTH ONE TIME DAILY 90 tablet 1   ciclopirox (PENLAC) 8 % solution Apply topically at bedtime. Apply over nail and surrounding skin. Apply daily over previous coat. After seven (7) days, may remove with alcohol and continue cycle. 6.6 mL 0   risperiDONE (RISPERDAL) 1 MG tablet Take 1 tablet by mouth 2 (two) times daily.     sertraline (ZOLOFT) 50 MG tablet Take 50 mg by mouth daily.     vancomycin (VANCOCIN) 125 MG capsule Take 1 capsule (125 mg total) by mouth 4 (four) times daily for 10 days. 40 capsule 0   Vitamin D, Ergocalciferol, (DRISDOL) 1.25 MG (50000 UNIT) CAPS capsule Take 1 capsule (50,000 Units total) by mouth once a week. 5 capsule 0   No facility-administered medications prior to visit.   Review of Systems  Constitutional:  Negative for chills, fever, malaise/fatigue and weight loss.  HENT:  Positive for congestion. Negative for sinus pain and sore throat.   Eyes: Negative.   Respiratory:  Positive for cough and sputum production. Negative for hemoptysis, shortness of breath and wheezing.   Cardiovascular:  Positive for chest pain. Negative for palpitations, orthopnea, claudication and leg swelling.  Gastrointestinal:  Positive for heartburn. Negative for abdominal pain, nausea and vomiting.  Genitourinary: Negative.   Musculoskeletal:  Negative for joint pain and myalgias.  Skin:  Negative for rash.  Neurological:  Negative for weakness.  Endo/Heme/Allergies:  Positive for environmental allergies.  Psychiatric/Behavioral:  Positive for depression. The patient is nervous/anxious.    Objective:   Vitals:   10/21/22 0926  BP: 122/84  Pulse: 82  SpO2: 97%  Weight: 142 lb (64.4 kg)  Height: 5\' 2"  (1.575 m)    Physical Exam Constitutional:      General: She is not in acute distress.    Appearance: She is not ill-appearing.  HENT:     Head: Normocephalic and atraumatic.  Eyes:     General: No scleral icterus.    Conjunctiva/sclera: Conjunctivae normal.     Pupils: Pupils are equal, round, and reactive to light.  Cardiovascular:     Rate and Rhythm: Normal rate and regular rhythm.     Pulses: Normal pulses.     Heart sounds: Normal heart sounds. No murmur heard. Pulmonary:     Effort: Pulmonary effort is normal.     Breath sounds: Normal  breath sounds. No wheezing, rhonchi or rales.  Abdominal:     General: Bowel sounds are normal.     Palpations: Abdomen is soft.  Musculoskeletal:     Right lower leg: No edema.     Left lower leg: No edema.  Lymphadenopathy:     Cervical: No cervical adenopathy.  Skin:    General: Skin is warm and dry.  Neurological:     General: No focal deficit present.     Mental Status: She is alert.  Psychiatric:        Mood and Affect: Mood normal.        Behavior: Behavior normal.        Thought Content: Thought content normal.        Judgment: Judgment normal.    CBC No results found for: "WBC", "RBC", "HGB", "HCT", "PLT", "MCV", "MCH", "MCHC", "RDW", "LYMPHSABS", "MONOABS", "EOSABS", "BASOSABS"   Chest imaging: CT Chest LCS 01/12/22 Mediastinum/Nodes: No pathologically enlarged mediastinal or hilar lymph nodes. Please note that accurate exclusion of hilar adenopathy is limited on noncontrast CT scans. Esophagus is unremarkable in appearance. No axillary lymphadenopathy.   Lungs/Pleura: No suspicious appearing pulmonary nodules or masses are noted. No acute consolidative airspace disease. No pleural effusions. Mild diffuse bronchial wall thickening with very mild centrilobular and paraseptal emphysema. Mild scarring noted in the medial segment of the right middle lobe and medial aspect of the lingula.   PFT:     No data to display           Labs:  Path:  Echo:  Heart Catheterization:    Assessment & Plan:   Centrilobular emphysema ([place]) - Plan: fluticasone-salmeterol (ADVAIR) 250-50 MCG/ACT AEPB, Pulmonary Function Test, Home sleep test  Cigarette smoker  Chronic cough  Post-nasal drainage - Plan: fluticasone (FLONASE) 50 MCG/ACT nasal spray  Gastroesophageal reflux disease without esophagitis - Plan: famotidine (PEPCID) 20 MG tablet  Snoring - Plan: Home sleep test  Discussion: Ashley Harding is a 57 year old woman, daily smoker with history of bipolar disorder, GERD, HLD and hypertension who is referred to pulmonary clinic for evaluation of COPD.   She has mild centrilobular emphysema on her CT chest scan along with cough and sputum production. Unsure if her chest discomfort is related to obstructive lung disease vs GERD vs cardiac etiology.   Will trial her on advair 250-38mcg 1 puff twice daily and monitor for improvement in the cough.  She is to start famotidine 20mg  daily for GERD.  She is to start fluticasone nasal spray for post-nasal drainage.  She has cardiac evaluation this afternoon, will follow up their recommendations/studies.  Recommend nicotine replacement therapy with 21mg  nicotine patch per day and mini nicotine lozenges as needed.  Follow up in 2-3 months with pulmonary function tests.   Freda Jackson, MD [facility] Office: [phone]   Current Outpatient Medications:    atorvastatin (LIPITOR) 20 MG tablet, TAKE ONE TABLET BY MOUTH ONE TIME DAILY, Disp: 90 tablet, Rfl: 1   ciclopirox (PENLAC) 8 % solution, Apply topically at bedtime. Apply over nail and surrounding skin. Apply daily over previous coat. After seven (7) days, may remove with alcohol and continue cycle., Disp: 6.6 mL, Rfl: 0   famotidine (PEPCID) 20 MG tablet, Take 1 tablet (20 mg total) by mouth daily., Disp: 30 tablet, Rfl: 2   fluticasone (FLONASE) 50 MCG/ACT nasal spray, Place 1 spray  into both nostrils daily., Disp: 16 g, Rfl: 2   fluticasone-salmeterol (ADVAIR) 250-50  MCG/ACT AEPB, Inhale 1 puff into the lungs in the morning and at bedtime., Disp: 60 each, Rfl: 2   risperiDONE (RISPERDAL) 1 MG tablet, Take 1 tablet by mouth 2 (two) times daily., Disp: , Rfl:    sertraline (ZOLOFT) 50 MG tablet, Take 50 mg by mouth daily., Disp: , Rfl:    vancomycin (VANCOCIN) 125 MG capsule, Take 1 capsule (125 mg total) by mouth 4 (four) times daily for 10 days., Disp: 40 capsule, Rfl: 0   Vitamin D, Ergocalciferol, (DRISDOL) 1.25 MG (50000 UNIT) CAPS capsule, Take 1 capsule (50,000 Units total) by mouth once a week., Disp: 5 capsule, Rfl: 0

## 2022-10-21 NOTE — Patient Instructions (Addendum)
Start fluticasone-salmeterol inhaler 1 puff twice daily - rinse mouth out after each use  Start pepcid 1 tab daily for heart burn  Start fluticasone nasal spray, 1 spary per nostril for sinus congestion  Recommend nicotine replacement therapy with 21mg  nicotine patch per day and mini nicotine lozenges as needed. The patches and lozenges can be picked up over the counter at the pharmacy or Call 1-800-quit-NOW to get free nicotine replacement and counseling from the state of [place]   Follow up in 2-3 months for visit and breathing tests.

## 2022-10-21 NOTE — Progress Notes (Signed)
Cardiology Office Note:    Date:  10/21/2022   ID:  JAHNIAH HAIDAR, DOB Aug 15, 1965, MRN [id number]  PCP:  Haydee Salter, MD   [place] Providers Cardiologist:  None     Referring MD: Haydee Salter, MD   No chief complaint on file. Chest pain  History of Present Illness:    Ashley Harding is a 57 y.o. female with a hx of bipolar type 1, GERD, HTN referral by his PCP Dr. Gena Fray for CAC seen on surveillance CT scan with hx of smoking (27 pack years). See in the LAD. No DM2. Blood pressure is well controlled. Sinus tach HR 101 today.  She says she has dull CP. It happens consistently. Exertion can make it worse, caffeine. She can also lay down and it happens. It comes and goes and it is steady throughout the day. It is nagging. She is still smoking. On lipitor 20 mg daily  LDL 66 mg/dL 05/12/2022  Family Hx: No family hx of cardiac dx  Social Hx: on short term disability. Typically works in a Forensic [employer]   Past Medical History:  Diagnosis Date   Bipolar 1 disorder ([place])    GERD (gastroesophageal reflux disease)    Hyperlipidemia    Hypertension     Past Surgical History:  Procedure Laterality Date   BREAST REDUCTION SURGERY Bilateral    COLONOSCOPY  02/06/2022   RECONSTRUCTION OF NOSE     REDUCTION MAMMAPLASTY      Current Medications: No outpatient medications have been marked as taking for the 10/21/22 encounter (Appointment) with Janina Mayo, MD.     Allergies:   Patient has no known allergies.   Social History   Socioeconomic History   Marital status: Significant Other    Spouse name: Not on file   Number of children: 1   Years of education: Not on file   Highest education level: Not on file  Occupational History   Occupation: Materials engineer    Comment: [employer] [place]  Tobacco Use   Smoking status: Every Day    Packs/day: 1.5    Types: Cigarettes   Smokeless tobacco: Never  Vaping Use   Vaping Use: Never used  Substance and Sexual  Activity   Alcohol use: Yes    Comment: wine glass or two nightly   Drug use: Never   Sexual activity: Yes    Birth control/protection: Post-menopausal  Other Topics Concern   Not on file  Social History Narrative   Not on file   Social Determinants of Health   Financial Resource Strain: Low Risk  (07/06/2022)   Overall Financial Resource Strain (CARDIA)    Difficulty of Paying Living Expenses: Not hard at all  Food Insecurity: No Food Insecurity (07/06/2022)   Hunger Vital Sign    Worried About Running Out of Food in the Last Year: Never true    Ran Out of Food in the Last Year: Never true  Transportation Needs: No Transportation Needs (07/06/2022)   PRAPARE - Hydrologist (Medical): No    Lack of Transportation (Non-Medical): No  Physical Activity: Sufficiently Active (07/06/2022)   Exercise Vital Sign    Days of Exercise per Week: 4 days    Minutes of Exercise per Session: 150+ min  Stress: Stress Concern Present (07/06/2022)   [place]    Feeling of Stress : To some extent  Social Connections: Not on  file     Family History: The patient's family history includes Cancer in her father and maternal grandfather; Heart disease in her paternal grandfather; Hyperlipidemia in her father and mother; Hypertension in her father and mother; Stomach cancer in her maternal grandfather; Stroke in her maternal grandmother and mother. There is no history of Colon polyps, Colon cancer, Esophageal cancer, or Rectal cancer.  ROS:   Please see the history of present illness.     All other systems reviewed and are negative.  EKGs/Labs/Other Studies Reviewed:    The following studies were reviewed today:   EKG:  EKG is  ordered today.  The ekg ordered today demonstrates   10/21/2022- sinus tachycardia HR 101 bpm  Recent Labs: 06/12/2022: ALT 13  Recent Lipid Panel    Component Value Date/Time    CHOL 181 05/12/2022 0811   TRIG 192.0 (H) 05/12/2022 0811   HDL 76.90 05/12/2022 0811   CHOLHDL 2 05/12/2022 0811   VLDL 38.4 05/12/2022 0811   LDLCALC 66 05/12/2022 0811   LDLDIRECT 191.0 08/15/2021 0808     Risk Assessment/Calculations:          Physical Exam:    VS:  Vitals:   10/21/22 1344  BP: 126/74  Pulse: (!) 101  SpO2: 96%    Wt Readings from Last 3 Encounters:  10/21/22 142 lb (64.4 kg)  10/06/22 137 lb 9.6 oz (62.4 kg)  09/30/22 140 lb 3.2 oz (63.6 kg)     GEN:  Well nourished, well developed in no acute distress HEENT: Normal NECK: No JVD; No carotid bruits LYMPHATICS: No lymphadenopathy CARDIAC: RRR, no murmurs, rubs, gallops RESPIRATORY:  Clear to auscultation without rales, wheezing or rhonchi  ABDOMEN: Soft, non-tender, non-distended MUSCULOSKELETAL:  No edema; No deformity  SKIN: Warm and dry NEUROLOGIC:  Alert and oriented x 3 PSYCHIATRIC:  Normal affect   ASSESSMENT:   Possible cardiac chest pain/CAC: She reports dull CP with activity, but also with almost any activity. Symptoms are atypical. She has risk factors with smoking and CAC noted on her CT chest. Will risk stratify with a coronary CTA. Will get a TTE as well.   Smoking: no plan to quit; helps her cope with bipolar disorder PLAN:    In order of problems listed above:  TTE Coronary CTA with 100 metop tartrate Follow up pending results        Medication Adjustments/Labs and Tests Ordered: Current medicines are reviewed at length with the patient today.  Concerns regarding medicines are outlined above.  No orders of the defined types were placed in this encounter.  No orders of the defined types were placed in this encounter.   There are no Patient Instructions on file for this visit.   Signed, Janina Mayo, MD  10/21/2022 11:28 AM    [place]

## 2022-10-21 NOTE — Patient Instructions (Signed)
Medication Instructions:  Your physician recommends that you continue on your current medications as directed. Please refer to the Current Medication list given to you today.  *If you need a refill on your cardiac medications before your next appointment, please call your pharmacy*   Lab Work: Your physician recommends that you return 1 week prior to your CTA to have the following lab drawn: BMET  If you have labs (blood work) drawn today and your tests are completely normal, you will receive your results only by: [facility] (if you have MyChart) OR A paper copy in the mail If you have any lab test that is abnormal or we need to change your treatment, we will call you to review the results.   Testing/Procedures: Your physician has requested that you have an echocardiogram. Echocardiography is a painless test that uses sound waves to create images of your heart. It provides your doctor with information about the size and shape of your heart and how well your heart's chambers and valves are working. This procedure takes approximately one hour. There are no restrictions for this procedure. Please do NOT wear cologne, perfume, aftershave, or lotions (deodorant is allowed). Please arrive 15 minutes prior to your appointment time.  Cardiac CT Angiography (CTA), is a special type of CT scan that uses a computer to produce multi-dimensional views of major blood vessels throughout the body. In CT angiography, a contrast material is injected through an IV to help visualize the blood vessels     Follow-Up: At [facility], you and your health needs are our priority.  As part of our continuing mission to provide you with exceptional heart care, we have created designated Provider Care Teams.  These Care Teams include your primary Cardiologist (physician) and Advanced Practice Providers (APPs -  Physician Assistants and Nurse Practitioners) who all work together to provide you with the care  you need, when you need it.  We recommend signing up for the patient portal called "MyChart".  Sign up information is provided on this After Visit Summary.  MyChart is used to connect with patients for Virtual Visits (Telemedicine).  Patients are able to view lab/test results, encounter notes, upcoming appointments, etc.  Non-urgent messages can be sent to your provider as well.   To learn more about what you can do with MyChart, go to [email or web address].    Your next appointment:   Pending results  Provider:   Janina Mayo, MD     Other Instructions   Your cardiac CT will be scheduled at one of the below locations:   [facility] [address] [place], [place] [address] [phone]  If scheduled at [facility], please arrive at the [facility] and Children's Entrance (Entrance C2) of [facility] 30 minutes prior to test start time. You can use the FREE valet parking offered at entrance C (encouraged to control the heart rate for the test)  Proceed to the [facility] Radiology Department (first floor) to check-in and test prep.  All radiology patients and guests should use entrance C2 at [facility], accessed from [facility], even though the hospital's physical address listed is [address]     Please follow these instructions carefully (unless otherwise directed):  On the Night Before the Test: Be sure to Drink plenty of water. Do not consume any caffeinated/decaffeinated beverages or chocolate 12 hours prior to your test. Do not take any antihistamines 12 hours prior to your  test.  On the Day of the Test: Drink plenty of water until 1 hour prior to the test. Do not eat any food 1 hour prior to test. You may take your regular medications prior to the test.  Take metoprolol (Lopressor) 100mg  two hours prior to test. If you take Furosemide/Hydrochlorothiazide/Spironolactone, please HOLD on the morning of the  test. FEMALES- please wear underwire-free bra if available, avoid dresses & tight clothing       After the Test: Drink plenty of water. After receiving IV contrast, you may experience a mild flushed feeling. This is normal. On occasion, you may experience a mild rash up to 24 hours after the test. This is not dangerous. If this occurs, you can take Benadryl 25 mg and increase your fluid intake. If you experience trouble breathing, this can be serious. If it is severe call 911 IMMEDIATELY. If it is mild, please call our office. If you take any of these medications: Glipizide/Metformin, Avandament, Glucavance, please do not take 48 hours after completing test unless otherwise instructed.  We will call to schedule your test 2-4 weeks out understanding that some insurance companies will need an authorization prior to the service being performed.   For non-scheduling related questions, please contact the cardiac imaging nurse navigator should you have any questions/concerns: Marchia Bond, Cardiac Imaging Nurse Navigator Gordy Clement, Cardiac Imaging Nurse Navigator [facility] Direct Office Dial: [phone]   For scheduling needs, including cancellations and rescheduling, please call [place], [phone].

## 2022-10-22 NOTE — Addendum Note (Signed)
Addended by: Hinton Dyer on: 10/22/2022 09:59 AM   Modules accepted: Orders

## 2022-10-23 ENCOUNTER — Ambulatory Visit (INDEPENDENT_AMBULATORY_CARE_PROVIDER_SITE_OTHER): Payer: Medicare HMO | Admitting: Family Medicine

## 2022-10-23 ENCOUNTER — Encounter: Payer: Self-pay | Admitting: Family Medicine

## 2022-10-23 ENCOUNTER — Other Ambulatory Visit: Payer: Self-pay | Admitting: Family Medicine

## 2022-10-23 ENCOUNTER — Other Ambulatory Visit: Payer: Self-pay

## 2022-10-23 VITALS — BP 118/70 | HR 83 | Temp 98.0°F | Ht 62.0 in | Wt 139.8 lb

## 2022-10-23 DIAGNOSIS — J449 Chronic obstructive pulmonary disease, unspecified: Secondary | ICD-10-CM

## 2022-10-23 DIAGNOSIS — N904 Leukoplakia of vulva: Secondary | ICD-10-CM | POA: Diagnosis not present

## 2022-10-23 DIAGNOSIS — J301 Allergic rhinitis due to pollen: Secondary | ICD-10-CM | POA: Diagnosis not present

## 2022-10-23 DIAGNOSIS — Z01812 Encounter for preprocedural laboratory examination: Secondary | ICD-10-CM

## 2022-10-23 DIAGNOSIS — N760 Acute vaginitis: Secondary | ICD-10-CM

## 2022-10-23 DIAGNOSIS — R079 Chest pain, unspecified: Secondary | ICD-10-CM | POA: Diagnosis not present

## 2022-10-23 LAB — POCT URINALYSIS DIPSTICK
Bilirubin, UA: NEGATIVE
Blood, UA: NEGATIVE
Glucose, UA: NEGATIVE
Ketones, UA: 5
Leukocytes, UA: NEGATIVE
Nitrite, UA: NEGATIVE
Protein, UA: NEGATIVE
Spec Grav, UA: 1.025 (ref 1.010–1.025)
Urobilinogen, UA: 0.2 E.U./dL
pH, UA: 5.5 (ref 5.0–8.0)

## 2022-10-23 MED ORDER — CLOBETASOL PROPIONATE 0.05 % EX OINT: 1.0000 | TOPICAL_OINTMENT | Freq: Every evening | CUTANEOUS | 2 refills | Status: DC

## 2022-10-23 NOTE — Progress Notes (Addendum)
[facility] [address] [place] [address] Dept: [phone] Dept Fax: [phone]  Office Visit  Subjective:    Patient ID: Ashley Harding, female    DOB: 26-Jun-1966, 57 y.o..   MRN: [id number]  Chief Complaint  Patient presents with   Dysuria    C/o having burning sensation with/WO urination x 2 weeks.   Also has paperwork to be filled out.    History of Present Illness:  Patient is in today complaining of a burning sensation in her groin over the past 2 weeks. This is not necessarily present with urination. She does relate this to beginning during the time when she was having persistent diarrhea and when we started treatment with vancomycin for C. difficile. She has not noted any vaginal discharge.  Ms. Beaber has been seen by pulmonology. She was told she has some mild COPD. The pulmonologist felt her cough issues were likely due to allergic rhinitis issues and have prescribed Flonase and Advair.  Ms. Musselwhite was seen by cardiology. She is scheduled for an echocardiogram and CAC scan next week. She persists in having left-sided chest pain.  Past Medical History: Patient Active Problem List   Diagnosis Date Noted   Clostridium difficile diarrhea 10/12/2022   Chest pain 09/30/2022   Chronic cough 09/30/2022   Laryngopharyngeal reflux (LPR) 07/21/2022   COPD (chronic obstructive pulmonary disease) ([place]) 07/21/2022   Aortic atherosclerosis ([place]) 01/14/2022   Coronary artery calcification seen on CT scan 01/14/2022   Emphysema lung ([place]) 01/14/2022   Hyperlipidemia 08/15/2021   Bipolar disorder, in full remission, most recent episode depressed ([place]) 01/15/2016   Vitamin D deficiency 11/28/2015   Postconcussion syndrome 11/27/2015   Tobacco use disorder 11/27/2015   Past Surgical History:  Procedure Laterality Date   BREAST REDUCTION SURGERY Bilateral    COLONOSCOPY  02/06/2022   RECONSTRUCTION OF NOSE      REDUCTION MAMMAPLASTY     Family History  Problem Relation Age of Onset   Hyperlipidemia Mother    Hypertension Mother    Stroke Mother    Cancer Father        Skin   Hypertension Father    Hyperlipidemia Father    Stroke Maternal Grandmother    Stomach cancer Maternal Grandfather    Cancer Maternal Grandfather        Stomach   Heart disease Paternal Grandfather    Colon polyps Neg Hx    Colon cancer Neg Hx    Esophageal cancer Neg Hx    Rectal cancer Neg Hx    Outpatient Medications Prior to Visit  Medication Sig Dispense Refill   atorvastatin (LIPITOR) 20 MG tablet TAKE ONE TABLET BY MOUTH ONE TIME DAILY 90 tablet 1   ciclopirox (PENLAC) 8 % solution Apply topically at bedtime. Apply over nail and surrounding skin. Apply daily over previous coat. After seven (7) days, may remove with alcohol and continue cycle. 6.6 mL 0   famotidine (PEPCID) 20 MG tablet Take 1 tablet (20 mg total) by mouth daily. 30 tablet 2   fluticasone (FLONASE) 50 MCG/ACT nasal spray Place 1 spray into both nostrils daily. 16 g 2   fluticasone-salmeterol (ADVAIR) 250-50 MCG/ACT AEPB Inhale 1 puff into the lungs in the morning and at bedtime. 60 each 2   metoprolol tartrate (LOPRESSOR) 100 MG tablet Take 2 hours prior to CT 1 tablet 0   risperiDONE (RISPERDAL) 1 MG tablet Take 1 tablet by mouth 2 (two) times daily.  sertraline (ZOLOFT) 50 MG tablet Take 50 mg by mouth daily.     Vitamin D, Ergocalciferol, (DRISDOL) 1.25 MG (50000 UNIT) CAPS capsule Take 1 capsule (50,000 Units total) by mouth once a week. 5 capsule 0   No facility-administered medications prior to visit.   No Known Allergies   Objective:   Today's Vitals   10/23/22 1330  BP: 118/70  Pulse: 83  Temp: 98 F (36.7 C)  TempSrc: Temporal  SpO2: 96%  Weight: 139 lb 12.8 oz (63.4 kg)  Height: 5\' 2"  (1.575 m)   Body mass index is 25.57 kg/m.   General: Well developed, well nourished. No acute distress. GU: The labia minora are  quite red with thin appearing skin. Vaginal vault is pink without   discharge. Cervix is pink and appears normal.  Psych: Alert and oriented. Normal mood and affect.  There are no preventive care reminders to display for this patient.    Lab results: Urinalysis Component Ref Range & Units 13:46  Color, UA dark yellow  Clarity, UA clear  Glucose, UA Negative Negative  Bilirubin, UA neg  Ketones, UA 5 mg/dl  Spec Grav, UA 1.010 - 1.025 1.025  Blood, UA neg  pH, UA 5.0 - 8.0 5.5  Protein, UA Negative Negative  Urobilinogen, UA 0.2 or 1.0 E.U./dL 0.2  Nitrite, UA neg  Leukocytes, UA Negative Negative   Assessment & Plan:   Problem List Items Addressed This Visit       Respiratory   COPD (chronic obstructive pulmonary disease) ([place])    Pulmonology has added Advair to her regimen. 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      Allergic rhinitis due to pollen     Genitourinary   Lichen sclerosus et atrophicus of the vulva    The labial exam is consistent with lichen sclerosus. I will start Ms. Backer on nightly clobetasol ointment for 12 weeks. I will plan to see her back at that point and taper medication if I am able.      Relevant Medications   clobetasol ointment (TEMOVATE) 0.05 %     Other   Chest pain    Cotninue to follow with cardiology to complete assessment.  Disability forms completed.      Other Visit Diagnoses     Acute vaginitis    -  Primary   No sign on exam of a yeast vaginitis. I obtained swabs to send for trichomonas, yeast, and BV assessment.   Relevant Orders   POCT Urinalysis Dipstick (Completed)   Cervicovaginal ancillary only       Return for Follow-up after testing/imaging.   Haydee Salter, MD

## 2022-10-23 NOTE — Patient Instructions (Signed)
Lichen Sclerosus Lichen sclerosus is a skin problem. It can happen on any part of the body, but it commonly involves the anal and genital areas. It can cause itching and discomfort in these areas. Treatment can help to control symptoms. When the genital area is affected, getting treatment is important because the condition can cause scarring that may lead to other problems if left untreated. What are the causes? The cause of this condition is not known. It may be related to an overactive immune system or a lack of certain hormones. Lichen sclerosus is not an infection or a fungus, and it is not passed from one person to another (non-contagious). What increases the risk? The following factors may make you more likely to develop this condition: You are a woman who has reached menopause. You are a man who was not circumcised. This condition may also develop for the first time in children, usually before they enter puberty. What are the signs or symptoms? Symptoms of this condition include: White areas (plaques) on the skin that may be thin and wrinkled, or thickened. Red and swollen patches (lesions) on the skin. Tears or cracks in the skin. Bruising. Blood blisters. Severe itching. Pain, itching, or burning when urinating. Constipation is also common in children with lichen sclerosus, but can be seen in adults. How is this diagnosed? This condition may be diagnosed with a physical exam. In some cases, a tissue sample may be removed to be checked under a microscope (biopsy). How is this treated? This condition may be treated with: Topical steroids. These are medicated creams or ointments that are applied over the affected areas. Medicines that are taken by mouth. Topical immunotherapy. These are medicated creams or ointments that are applied over the affected areas. They stimulate your immune system to fight the skin condition. This may be used if steroids are not effective. Surgery. This may  be needed in more severe cases that are causing problems such as scarring. Follow these instructions at home: Medicines Take over-the-counter and prescription medicines only as told by your health care provider. Use creams or ointments as told by your health care provider. Skin care Do not scratch the affected areas of skin. If you are a woman, be sure to keep the vaginal area as clean and dry as possible. Clean the affected area of skin gently with water only. Pat skin dry and avoid the use of rough towels or toilet paper. Avoid irritating skin products, including soap and scented lotions. Use emollient creams as directed by your health care provider to help reduce itching. General instructions Keep all follow-up visits. This is important. Your condition may cause constipation. To prevent or treat constipation, you may need to: Drink enough fluid to keep your urine pale yellow. Take over-the-counter or prescription medicines. Eat foods that are high in fiber, such as beans, whole grains, and fresh fruits and vegetables. Limit foods that are high in fat and processed sugars, such as fried or sweet foods. Contact a health care provider if: You have increasing redness, swelling, or pain in the affected area. You have fluid, blood, or pus coming from the affected area. You have new lesions on your skin. You have a fever. You have pain during sex. Get help right away if: You develop severe pain or burning in the affected areas, especially in the genital area. Summary Lichen sclerosus is a skin problem. When the genital area is affected, getting treatment is important because the condition can cause scarring that may   lead to other problems if left untreated. This condition is usually treated with medicated creams or ointments (topical steroids) that are applied over the affected areas. Take or use over-the-counter and prescription medicines only as told by your health care provider. Contact a  health care provider if you have new lesions on your skin, have pain during sex, or have increasing redness, swelling, or pain in the affected area. Keep all follow-up visits. This is important. This information is not intended to replace advice given to you by your health care provider. Make sure you discuss any questions you have with your health care provider. Document Revised: 12/02/2019 Document Reviewed: 12/02/2019 Elsevier Patient Education  2023 Elsevier Inc.  

## 2022-10-23 NOTE — Assessment & Plan Note (Signed)
Pulmonology has added Advair to her regimen. 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

## 2022-10-23 NOTE — Assessment & Plan Note (Signed)
Cotninue to follow with cardiology to complete assessment.  Disability forms completed.

## 2022-10-23 NOTE — Assessment & Plan Note (Signed)
The labial exam is consistent with lichen sclerosus. I will start Ashley Harding on nightly clobetasol ointment for 12 weeks. I will plan to see her back at that point and taper medication if I am able.

## 2022-10-24 LAB — BASIC METABOLIC PANEL
BUN/Creatinine Ratio: 12 (ref 9–23)
BUN: 11 mg/dL (ref 6–24)
CO2: 22 mmol/L (ref 20–29)
Calcium: 9.8 mg/dL (ref 8.7–10.2)
Chloride: 104 mmol/L (ref 96–106)
Creatinine, Ser: 0.89 mg/dL (ref 0.57–1.00)
Glucose: 89 mg/dL (ref 70–99)
Potassium: 5 mmol/L (ref 3.5–5.2)
Sodium: 143 mmol/L (ref 134–144)
eGFR: 76 mL/min/{1.73_m2} (ref >59)

## 2022-10-26 ENCOUNTER — Telehealth: Payer: Self-pay

## 2022-10-26 ENCOUNTER — Telehealth: Payer: Self-pay | Admitting: Family Medicine

## 2022-10-26 NOTE — Telephone Encounter (Signed)
Disability form filled and and faxed to [facility] @ [phone].  Dm/cma

## 2022-10-26 NOTE — Telephone Encounter (Signed)
Ashley Harding from [facility] cytology needs to clarify something with the specimen. You can call back at [phone]. She will be leaving soon so ask for [place].

## 2022-10-27 ENCOUNTER — Telehealth (HOSPITAL_COMMUNITY): Payer: Self-pay | Admitting: Emergency Medicine

## 2022-10-27 LAB — MOLECULAR ANCILLARY ONLY
Comment: NEGATIVE
Trichomonas: NEGATIVE

## 2022-10-27 NOTE — Telephone Encounter (Signed)
Spoke to [place] at [facility] lab. They can only run the test for Trichonosis off swab received.  The other tests need to be done with the  Aptima Orange Swab.  Dm/cma

## 2022-10-27 NOTE — Telephone Encounter (Signed)
Attempted to call patient regarding upcoming cardiac CT appointment. °Left message on voicemail with name and callback number °Timera Windt RN Navigator Cardiac Imaging °[facility] °[phone] Office °[phone] Cell ° °

## 2022-10-27 NOTE — Telephone Encounter (Signed)
Reaching out to patient to offer assistance regarding upcoming cardiac imaging study; pt verbalizes understanding of appt date/time, parking situation and where to check in, pre-test NPO status and medications ordered, and verified current allergies; name and call back number provided for further questions should they arise Marchia Bond RN Navigator Cardiac Imaging [facility] [phone] office [phone] cell  Arrival [address] entrance 100mg  metoprolol tartrate  Denies IV issues Aware contrast/nitro

## 2022-10-28 ENCOUNTER — Ambulatory Visit (HOSPITAL_COMMUNITY)
Admission: RE | Admit: 2022-10-28 | Discharge: 2022-10-28 | Disposition: A | Discharge status: Home / Self Care | Payer: Medicare HMO | Source: Ambulatory Visit | Attending: Internal Medicine | Admitting: Internal Medicine

## 2022-10-28 DIAGNOSIS — R072 Precordial pain: Secondary | ICD-10-CM | POA: Diagnosis not present

## 2022-10-28 MED ORDER — METOPROLOL TARTRATE 5 MG/5ML IV SOLN
5.0000 mg | INTRAVENOUS | Status: DC | PRN
  Administered 2022-10-28: 5 mg via INTRAVENOUS

## 2022-10-28 MED ORDER — IOHEXOL 350 MG/ML SOLN
100.0000 mL | Freq: Once | INTRAVENOUS | Status: AC | PRN
  Administered 2022-10-28: 100 mL via INTRAVENOUS

## 2022-10-28 MED ORDER — METOPROLOL TARTRATE 5 MG/5ML IV SOLN
INTRAVENOUS | Status: AC
  Administered 2022-10-28: 5 mg via INTRAVENOUS
  Filled 2022-10-28: qty 5

## 2022-10-28 MED ORDER — NITROGLYCERIN 0.4 MG SL SUBL
0.8000 mg | SUBLINGUAL_TABLET | Freq: Once | SUBLINGUAL | Status: AC
  Administered 2022-10-28: 0.8 mg via SUBLINGUAL

## 2022-10-28 MED ORDER — METOPROLOL TARTRATE 5 MG/5ML IV SOLN: 5.0000 mg | INTRAVENOUS | Status: DC | PRN

## 2022-10-28 MED ORDER — METOPROLOL TARTRATE 5 MG/5ML IV SOLN: 10.0000 mg | INTRAVENOUS | Status: DC | PRN

## 2022-10-28 MED ORDER — NITROGLYCERIN 0.4 MG SL SUBL
SUBLINGUAL_TABLET | SUBLINGUAL | Status: AC
  Filled 2022-10-28: qty 2

## 2022-10-28 MED ORDER — METOPROLOL TARTRATE 5 MG/5ML IV SOLN
INTRAVENOUS | Status: AC
  Administered 2022-10-28: 5 mg via INTRAVENOUS
  Filled 2022-10-28: qty 10

## 2022-11-10 ENCOUNTER — Other Ambulatory Visit: Payer: Self-pay

## 2022-11-10 DIAGNOSIS — E559 Vitamin D deficiency, unspecified: Secondary | ICD-10-CM

## 2022-11-10 MED ORDER — VITAMIN D (ERGOCALCIFEROL) 1.25 MG (50000 UNIT) PO CAPS: 50000.0000 [IU] | ORAL_CAPSULE | ORAL | 0 refills | Status: DC

## 2022-11-10 NOTE — Telephone Encounter (Signed)
Received a refill request for  Vitamin D2 50000 units LR 10/02/2022, #5, 0 rf Last labs done 08/15/21  Please review and advise.  Thanks.  Dm/cma

## 2022-11-18 ENCOUNTER — Ambulatory Visit (HOSPITAL_COMMUNITY): Payer: Medicare HMO | Attending: Cardiology

## 2022-11-18 DIAGNOSIS — R072 Precordial pain: Secondary | ICD-10-CM | POA: Diagnosis not present

## 2022-11-18 LAB — ECHOCARDIOGRAM COMPLETE
Area-P 1/2: 5.02 cm2
S' Lateral: 2.4 cm

## 2022-11-18 MED ORDER — PERFLUTREN LIPID MICROSPHERE
1.0000 mL | INTRAVENOUS | Status: AC | PRN
  Administered 2022-11-18: 2 mL via INTRAVENOUS

## 2022-11-19 ENCOUNTER — Encounter: Payer: Self-pay | Admitting: Family Medicine

## 2022-11-20 NOTE — Telephone Encounter (Signed)
Spoke to patient after speaking to provider, he will not extend her leave from work. She also asked about getting a referral for a new psychologist doctor then decided to stay with who she sees. Dm/cma

## 2022-11-27 ENCOUNTER — Telehealth: Payer: Self-pay

## 2022-11-27 ENCOUNTER — Encounter: Payer: Self-pay | Admitting: Family Medicine

## 2022-11-27 ENCOUNTER — Encounter: Payer: Self-pay | Admitting: Pulmonary Disease

## 2022-11-27 DIAGNOSIS — R079 Chest pain, unspecified: Secondary | ICD-10-CM

## 2022-11-27 NOTE — Telephone Encounter (Signed)
Received FMLA forms for patient- recent CCTA showed minimal plaque. Will forward to Dr. Wyline Mood to see if able to fill out forms.

## 2022-12-01 NOTE — Telephone Encounter (Signed)
Per Dr, Wyline Mood  Please let Mrs. Wixted know I have no indication to fill out FMLA paperwork. Chest pain is unlikely cardiac related considering her age and atypical symptoms. The good news is, her CT ordered to help differentiate her risk showed minimal plaque. Her last LDL was at goal, she can continue her lipitor.    Attempted to call patient, unable to reach. Left message okay per DPR advising of the above and to call back with questions.

## 2022-12-13 DIAGNOSIS — G4730 Sleep apnea, unspecified: Secondary | ICD-10-CM | POA: Diagnosis not present

## 2022-12-17 ENCOUNTER — Other Ambulatory Visit: Payer: Self-pay

## 2022-12-17 DIAGNOSIS — E559 Vitamin D deficiency, unspecified: Secondary | ICD-10-CM

## 2022-12-17 MED ORDER — VITAMIN D (ERGOCALCIFEROL) 1.25 MG (50000 UNIT) PO CAPS: 50000.0000 [IU] | ORAL_CAPSULE | ORAL | 0 refills | Status: AC

## 2022-12-21 ENCOUNTER — Ambulatory Visit: Payer: Medicare HMO | Admitting: Podiatry

## 2022-12-31 ENCOUNTER — Ambulatory Visit: Payer: Medicare HMO | Admitting: Pulmonary Disease

## 2022-12-31 ENCOUNTER — Encounter: Payer: Self-pay | Admitting: Pulmonary Disease

## 2022-12-31 ENCOUNTER — Encounter (INDEPENDENT_AMBULATORY_CARE_PROVIDER_SITE_OTHER): Payer: Medicare HMO

## 2022-12-31 ENCOUNTER — Ambulatory Visit (INDEPENDENT_AMBULATORY_CARE_PROVIDER_SITE_OTHER): Payer: Medicare HMO | Admitting: Pulmonary Disease

## 2022-12-31 VITALS — BP 126/74 | HR 85 | Ht 62.0 in | Wt 139.8 lb

## 2022-12-31 DIAGNOSIS — R0982 Postnasal drip: Secondary | ICD-10-CM

## 2022-12-31 DIAGNOSIS — G4733 Obstructive sleep apnea (adult) (pediatric): Secondary | ICD-10-CM

## 2022-12-31 DIAGNOSIS — K219 Gastro-esophageal reflux disease without esophagitis: Secondary | ICD-10-CM

## 2022-12-31 DIAGNOSIS — F17210 Nicotine dependence, cigarettes, uncomplicated: Secondary | ICD-10-CM | POA: Diagnosis not present

## 2022-12-31 DIAGNOSIS — J432 Centrilobular emphysema: Secondary | ICD-10-CM

## 2022-12-31 DIAGNOSIS — R0683 Snoring: Secondary | ICD-10-CM

## 2022-12-31 LAB — PULMONARY FUNCTION TEST
DL/VA % pred: 109 %
DL/VA: 4.73 ml/min/mmHg/L
DLCO cor % pred: 90 %
DLCO cor: 17.27 ml/min/mmHg
DLCO unc % pred: 90 %
DLCO unc: 17.27 ml/min/mmHg
FEF 25-75 Post: 1.52 L/sec
FEF 25-75 Pre: 1.25 L/sec
FEF2575-%Change-Post: 21 %
FEF2575-%Pred-Post: 63 %
FEF2575-%Pred-Pre: 52 %
FEV1-%Change-Post: 14 %
FEV1-%Pred-Post: 68 %
FEV1-%Pred-Pre: 60 %
FEV1-Post: 1.7 L
FEV1-Pre: 1.49 L
FEV1FVC-%Change-Post: 10 %
FEV1FVC-%Pred-Pre: 79 %
FEV6-%Change-Post: 5 %
FEV6-%Pred-Post: 79 %
FEV6-%Pred-Pre: 75 %
FEV6-Post: 2.43 L
FEV6-Pre: 2.31 L
FEV6FVC-%Pred-Post: 103 %
FEV6FVC-%Pred-Pre: 103 %
FVC-%Change-Post: 2 %
FVC-%Pred-Post: 76 %
FVC-%Pred-Pre: 74 %
FVC-Post: 2.43 L
FVC-Pre: 2.36 L
Post FEV1/FVC ratio: 70 %
Post FEV6/FVC ratio: 100 %
Pre FEV1/FVC ratio: 63 %
Pre FEV6/FVC Ratio: 100 %
RV % pred: 134 %
RV: 2.43 L
TLC % pred: 100 %
TLC: 4.8 L

## 2022-12-31 NOTE — Patient Instructions (Signed)
Full PFT performed today. °

## 2022-12-31 NOTE — Progress Notes (Signed)
Synopsis: Referred in March 2024 for COPD by Herbie Drape, MD  Subjective:   PATIENT ID: Ashley Harding GENDER: female DOB: 04/02/1966, MRN: [id number]  HPI  Chief Complaint  Patient presents with   Follow-up    F/U after PFT. States her breathing has been stable since last visit.    Adri Melchor is a 57 year old woman, daily smoker with history of bipolar disorder, GERD, HLD and hypertension who returns to pulmonary clinic for COPD.   Initial OV 10/21/22 She was seen by her PCP 10/06/22, note reviewed, where she complained of chronic left chest pain that waxes/wanes and worsens with exertion while at work. She also has persistent cough which prompted referral to our office. The chest pain is dull. She thought was related to heart burn and has been taking TUMs as needed which has helped. She doesn't think she has shortness of breath. Denies wheezing. She has cough with mucous production. She has sinus congestion and post-nasal drainage. She has seasonal allergies. She will take benedryl as needed. She does not use nasal sprays.   She reports history of snoring. She had a colonoscopy last summer and anesthesiologist was concerned for sleep apnea.   She is enrolled in lung cancer screening, last scan 01/2022 without suspicious nodules. Mild diffuse bronchial wall thickening and very mild centrilobular and paraseptal emphysema. Mild scarring in the medial segment of RML and medial aspect of lingula.   She started smoking in college and then quit at age [age] years. She has been smoking close to 20 years 1-2 packs per day. She works as a Soil scientist. No family history of lung disease.   Today - 12/31/22  She was started on advair 250-62mcg 1 puff twice daily, famotidine 20mg  daily for GERD and fluticasone nasal spray for post-nasal drainage.  Her cough symptoms have   PFTs today show moderate obstruction with significant bronchodilator response.   Will trial her on advair 250-52mcg 1 puff  twice daily and monitor for improvement in the cough.  She is to start famotidine 20mg  daily for GERD.  She is to start fluticasone nasal spray for post-nasal drainage.  Completed home sleep study, awaiting results.  Past Medical History:  Diagnosis Date   Bipolar 1 disorder (HCC)    GERD (gastroesophageal reflux disease)    Hyperlipidemia    Hypertension      Family History  Problem Relation Age of Onset   Hyperlipidemia Mother    Hypertension Mother    Stroke Mother    Cancer Father        Skin   Hypertension Father    Hyperlipidemia Father    Stroke Maternal Grandmother    Stomach cancer Maternal Grandfather    Cancer Maternal Grandfather        Stomach   Heart disease Paternal Grandfather    Colon polyps Neg Hx    Colon cancer Neg Hx    Esophageal cancer Neg Hx    Rectal cancer Neg Hx      Social History   Socioeconomic History   Marital status: Significant Other    Spouse name: Not on file   Number of children: 1   Years of education: Not on file   Highest education level: Not on file  Occupational History   Occupation: Curator    Comment: [employer]- [place]  Tobacco Use   Smoking status: Every Day    Packs/day: 1.5    Types: Cigarettes   Smokeless tobacco: Never  Vaping Use   Vaping Use: Never used  Substance and Sexual Activity   Alcohol use: Yes    Comment: wine glass or two nightly   Drug use: Never   Sexual activity: Yes    Birth control/protection: Post-menopausal  Other Topics Concern   Not on file  Social History Narrative   Not on file   Social Determinants of Health   Financial Resource Strain: Low Risk  (07/06/2022)   Overall Financial Resource Strain (CARDIA)    Difficulty of Paying Living Expenses: Not hard at all  Food Insecurity: No Food Insecurity (07/06/2022)   Hunger Vital Sign    Worried About Running Out of Food in the Last Year: Never true    Ran Out of Food in the Last Year: Never true  Transportation Needs: No  Transportation Needs (07/06/2022)   PRAPARE - Administrator, Civil Service (Medical): No    Lack of Transportation (Non-Medical): No  Physical Activity: Sufficiently Active (07/06/2022)   Exercise Vital Sign    Days of Exercise per Week: 4 days    Minutes of Exercise per Session: 150+ min  Stress: Stress Concern Present (07/06/2022)   [employer] of Occupational Health - Occupational Stress Questionnaire    Feeling of Stress : To some extent  Social Connections: Not on file  Intimate Partner Violence: Not on file     No Known Allergies   Outpatient Medications Prior to Visit  Medication Sig Dispense Refill   atorvastatin (LIPITOR) 20 MG tablet TAKE ONE TABLET BY MOUTH ONE TIME DAILY 90 tablet 1   ciclopirox (PENLAC) 8 % solution Apply topically at bedtime. Apply over nail and surrounding skin. Apply daily over previous coat. After seven (7) days, may remove with alcohol and continue cycle. 6.6 mL 0   clobetasol ointment (TEMOVATE) 0.05 % Apply 1 Application topically at bedtime. 30 g 2   famotidine (PEPCID) 20 MG tablet Take 1 tablet (20 mg total) by mouth daily. 30 tablet 2   fluticasone (FLONASE) 50 MCG/ACT nasal spray Place 1 spray into both nostrils daily. 16 g 2   fluticasone-salmeterol (ADVAIR) 250-50 MCG/ACT AEPB Inhale 1 puff into the lungs in the morning and at bedtime. 60 each 2   risperiDONE (RISPERDAL) 1 MG tablet Take 1 tablet by mouth 2 (two) times daily.     sertraline (ZOLOFT) 50 MG tablet Take 50 mg by mouth daily.     Vitamin D, Ergocalciferol, (DRISDOL) 1.25 MG (50000 UNIT) CAPS capsule Take 1 capsule (50,000 Units total) by mouth once a week. 5 capsule 0   metoprolol tartrate (LOPRESSOR) 100 MG tablet Take 2 hours prior to CT 1 tablet 0   No facility-administered medications prior to visit.   Review of Systems  Constitutional:  Negative for chills, fever, malaise/fatigue and weight loss.  HENT:  Positive for congestion. Negative for sinus pain and  sore throat.   Eyes: Negative.   Respiratory:  Positive for sputum production. Negative for cough, hemoptysis, shortness of breath and wheezing.   Cardiovascular:  Negative for chest pain, palpitations, orthopnea, claudication and leg swelling.  Gastrointestinal:  Positive for heartburn. Negative for abdominal pain, nausea and vomiting.  Genitourinary: Negative.   Musculoskeletal:  Negative for joint pain and myalgias.  Skin:  Negative for rash.  Neurological:  Negative for weakness.  Endo/Heme/Allergies:  Positive for environmental allergies.  Psychiatric/Behavioral:  Positive for depression. The patient is nervous/anxious.    Objective:   Vitals:   12/31/22 1006  BP:  126/74  Pulse: 85  SpO2: 96%  Weight: 139 lb 12.8 oz (63.4 kg)  Height: 5\' 2"  (1.575 m)   Physical Exam Constitutional:      General: She is not in acute distress.    Appearance: She is not ill-appearing.  HENT:     Head: Normocephalic and atraumatic.  Eyes:     General: No scleral icterus.    Conjunctiva/sclera: Conjunctivae normal.  Cardiovascular:     Rate and Rhythm: Normal rate and regular rhythm.     Pulses: Normal pulses.     Heart sounds: Normal heart sounds. No murmur heard. Pulmonary:     Effort: Pulmonary effort is normal.     Breath sounds: Normal breath sounds. No wheezing, rhonchi or rales.  Musculoskeletal:     Right lower leg: No edema.     Left lower leg: No edema.  Skin:    General: Skin is warm and dry.  Neurological:     General: No focal deficit present.     Mental Status: She is alert.    CBC No results found for: "WBC", "RBC", "HGB", "HCT", "PLT", "MCV", "MCH", "MCHC", "RDW", "LYMPHSABS", "MONOABS", "EOSABS", "BASOSABS"   Chest imaging: CT Chest LCS 01/12/22 Mediastinum/Nodes: No pathologically enlarged mediastinal or hilar lymph nodes. Please note that accurate exclusion of hilar adenopathy is limited on noncontrast CT scans. Esophagus is unremarkable in appearance. No  axillary lymphadenopathy.   Lungs/Pleura: No suspicious appearing pulmonary nodules or masses are noted. No acute consolidative airspace disease. No pleural effusions. Mild diffuse bronchial wall thickening with very mild centrilobular and paraseptal emphysema. Mild scarring noted in the medial segment of the right middle lobe and medial aspect of the lingula.   PFT:    Latest Ref Rng & Units 12/31/2022    8:46 AM  PFT Results  FVC-Pre L 2.36  P  FVC-Predicted Pre % 74  P  FVC-Post L 2.43  P  FVC-Predicted Post % 76  P  Pre FEV1/FVC % % 63  P  Post FEV1/FCV % % 70  P  FEV1-Pre L 1.49  P  FEV1-Predicted Pre % 60  P  FEV1-Post L 1.70  P  DLCO uncorrected ml/min/mmHg 17.27  P  DLCO UNC% % 90  P  DLCO corrected ml/min/mmHg 17.27  P  DLCO COR %Predicted % 90  P  DLVA Predicted % 109  P  TLC L 4.80  P  TLC % Predicted % 100  P  RV % Predicted % 134  P    P Preliminary result    Labs:  Path:  Echo:  Heart Catheterization:    Assessment & Plan:   Centrilobular emphysema (HCC)  Cigarette smoker  Gastroesophageal reflux disease without esophagitis  Post-nasal drainage  Discussion: Ashley Harding is a 57 year old woman, daily smoker with history of bipolar disorder, GERD, HLD and hypertension who returns to pulmonary clinic for COPD.   She has mild centrilobular emphysema on her CT chest scan along with cough and sputum production. She has moderate obstruction based on PFTs today with significant bronchodilator response and evidence of air trapping. I reviewed her PFTs in detail with her today.   I emphasized smoking cessation again today, we discussed options for 3 minutes. I informed her that her lung function will only continue to worsen if she keeps smoking. Recommend nicotine replacement therapy with 21mg  nicotine patch per day and mini nicotine lozenges as needed.  She is to continue advair 250-52mcg 1 puff twice daily. Reviewed  how to use her diskus inhaler  today.  Continue start famotidine 20mg  daily for GERD.  Continue fluticasone nasal spray for post-nasal drainage.  I will follow up with our sleep team regarding her home sleep study.  Follow up in 6 months  Melody Comas, MD [facility] Office: [phone]   Current Outpatient Medications:    atorvastatin (LIPITOR) 20 MG tablet, TAKE ONE TABLET BY MOUTH ONE TIME DAILY, Disp: 90 tablet, Rfl: 1   ciclopirox (PENLAC) 8 % solution, Apply topically at bedtime. Apply over nail and surrounding skin. Apply daily over previous coat. After seven (7) days, may remove with alcohol and continue cycle., Disp: 6.6 mL, Rfl: 0   clobetasol ointment (TEMOVATE) 0.05 %, Apply 1 Application topically at bedtime., Disp: 30 g, Rfl: 2   famotidine (PEPCID) 20 MG tablet, Take 1 tablet (20 mg total) by mouth daily., Disp: 30 tablet, Rfl: 2   fluticasone (FLONASE) 50 MCG/ACT nasal spray, Place 1 spray into both nostrils daily., Disp: 16 g, Rfl: 2   fluticasone-salmeterol (ADVAIR) 250-50 MCG/ACT AEPB, Inhale 1 puff into the lungs in the morning and at bedtime., Disp: 60 each, Rfl: 2   risperiDONE (RISPERDAL) 1 MG tablet, Take 1 tablet by mouth 2 (two) times daily., Disp: , Rfl:    sertraline (ZOLOFT) 50 MG tablet, Take 50 mg by mouth daily., Disp: , Rfl:    Vitamin D, Ergocalciferol, (DRISDOL) 1.25 MG (50000 UNIT) CAPS capsule, Take 1 capsule (50,000 Units total) by mouth once a week., Disp: 5 capsule, Rfl: 0

## 2022-12-31 NOTE — Progress Notes (Signed)
Full PFT performed today. °

## 2022-12-31 NOTE — Patient Instructions (Signed)
Continue advair diskus inhaler 1 puff twice daily - rinse mouth out after each use   Continue pepcid 1 tab daily for heart burn   Continue fluticasone nasal spray, 1 spary per nostril for sinus congestion   Recommend nicotine replacement therapy with 21mg  nicotine patch per day and mini nicotine lozenges as needed. The patches and lozenges can be picked up over the counter at the pharmacy or Call 1-800-quit-NOW to get free nicotine replacement and counseling from the state of [place]  Your breathing tests show mild to moderate obstruction consistent with COPD. They also show evidence of air trapping.   We will follow up on your sleep study results.  Follow up in 6 months

## 2023-01-03 ENCOUNTER — Encounter: Payer: Self-pay | Admitting: Pulmonary Disease

## 2023-01-03 DIAGNOSIS — J351 Hypertrophy of tonsils: Secondary | ICD-10-CM

## 2023-01-03 DIAGNOSIS — K219 Gastro-esophageal reflux disease without esophagitis: Secondary | ICD-10-CM

## 2023-01-04 NOTE — Telephone Encounter (Signed)
Dr. Francine Graven, this pt is asking for results of snap sleep study and also asking about ENT referral to look for enlarged tonsils. Please advise, thanks!

## 2023-01-19 NOTE — Telephone Encounter (Signed)
Referral order verified. Nothing further needed a this time.

## 2023-01-28 IMAGING — MG DIGITAL DIAGNOSTIC BILAT W/ TOMO W/ CAD
6 of 12 series · 6 of 36 positions shown · non-contrast
Comparison: Previous exam(s).

CLINICAL DATA: Callback from screening mammogram for possible
asymmetries in bilateral breasts

EXAM:
DIGITAL DIAGNOSTIC BILATERAL MAMMOGRAM WITH TOMOSYNTHESIS AND CAD
TECHNIQUE: Bilateral digital diagnostic mammography and breast tomosynthesis
was performed. The images were evaluated with computer-aided
detection.

[L CC synth-2D]
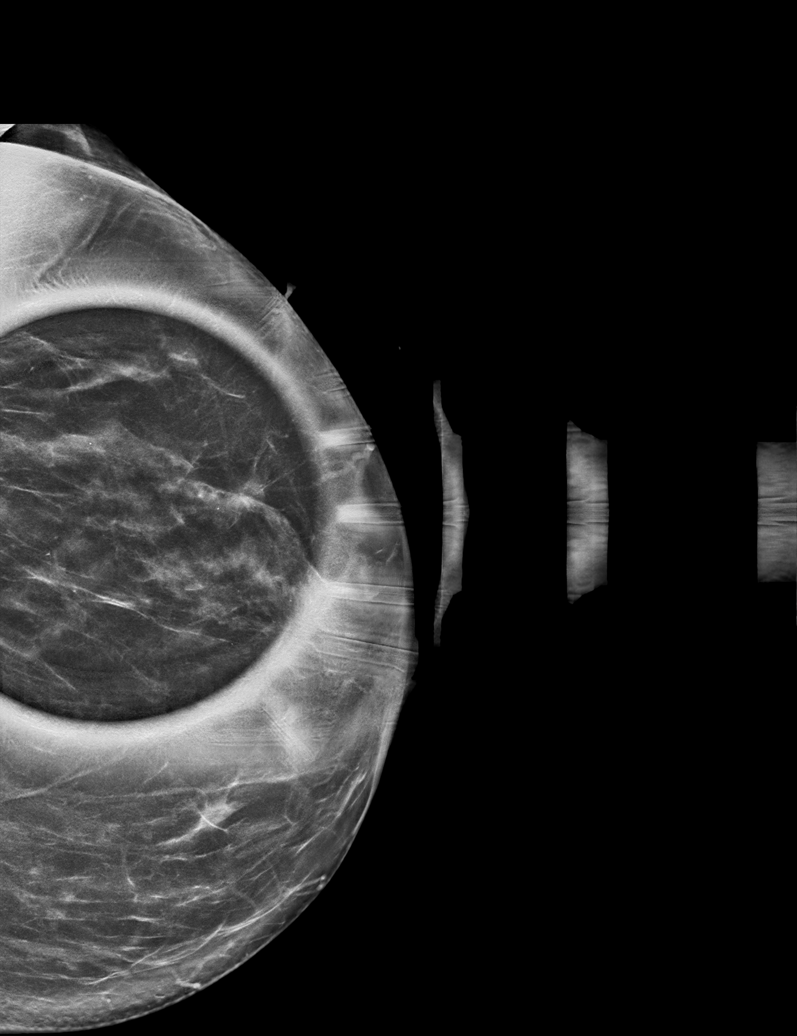

[R MLO synth-2D]
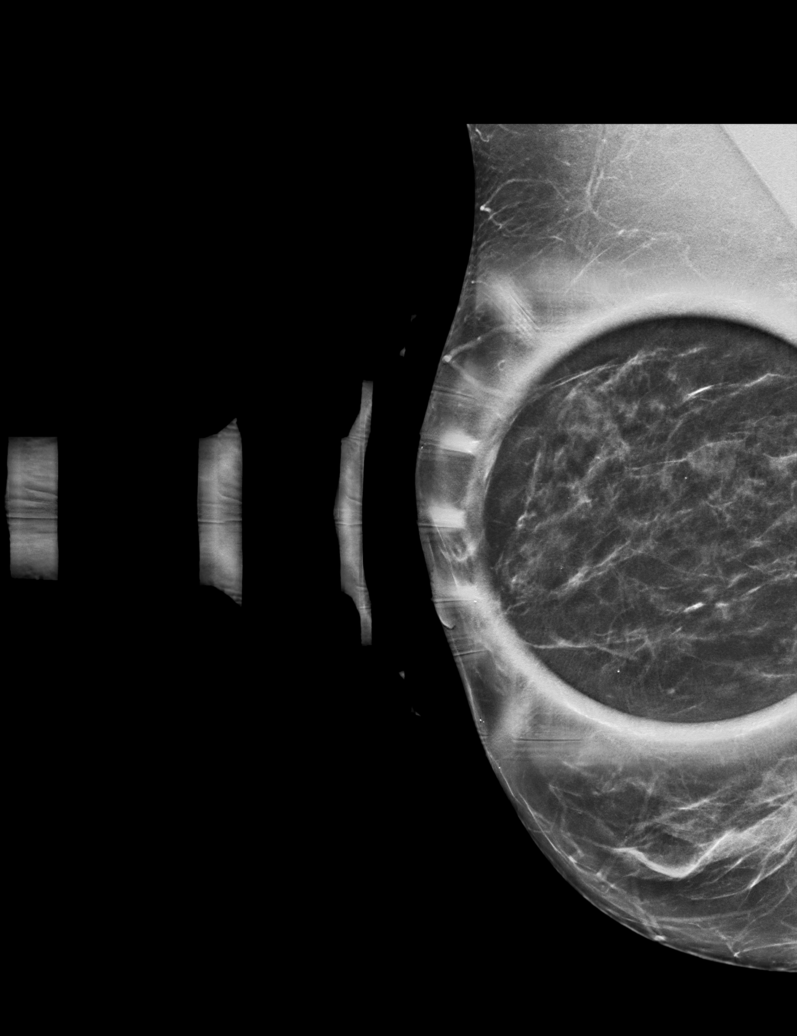

[L MLO synth-2D]
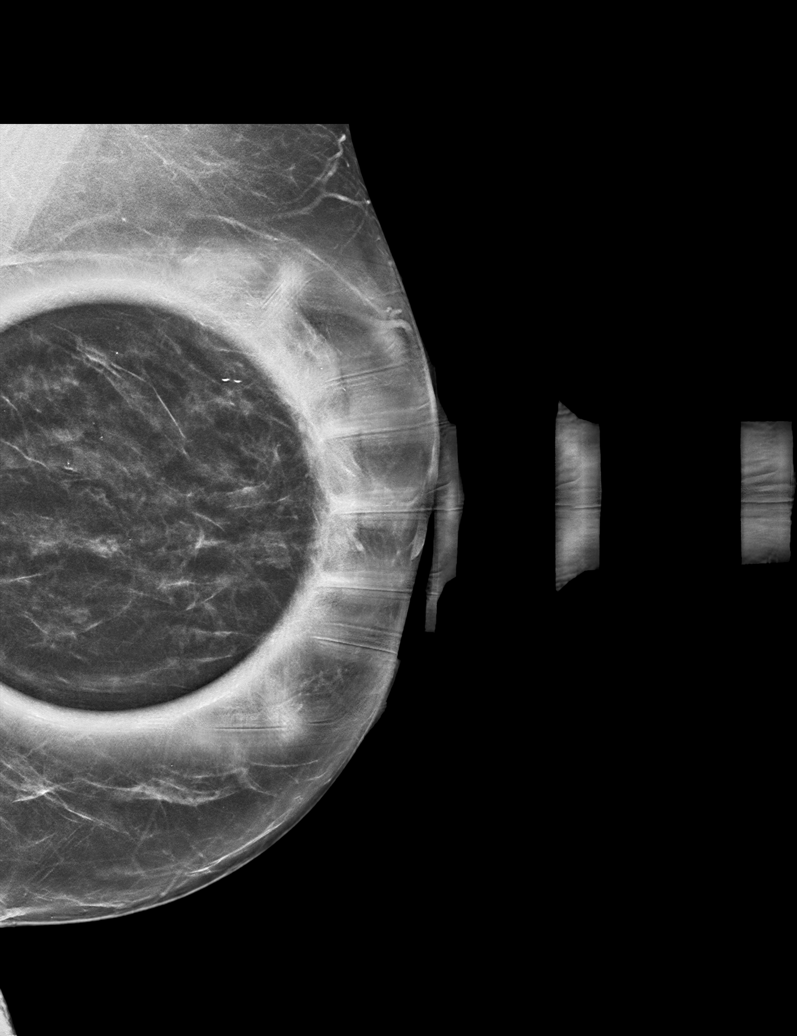

[R CC synth-2D]
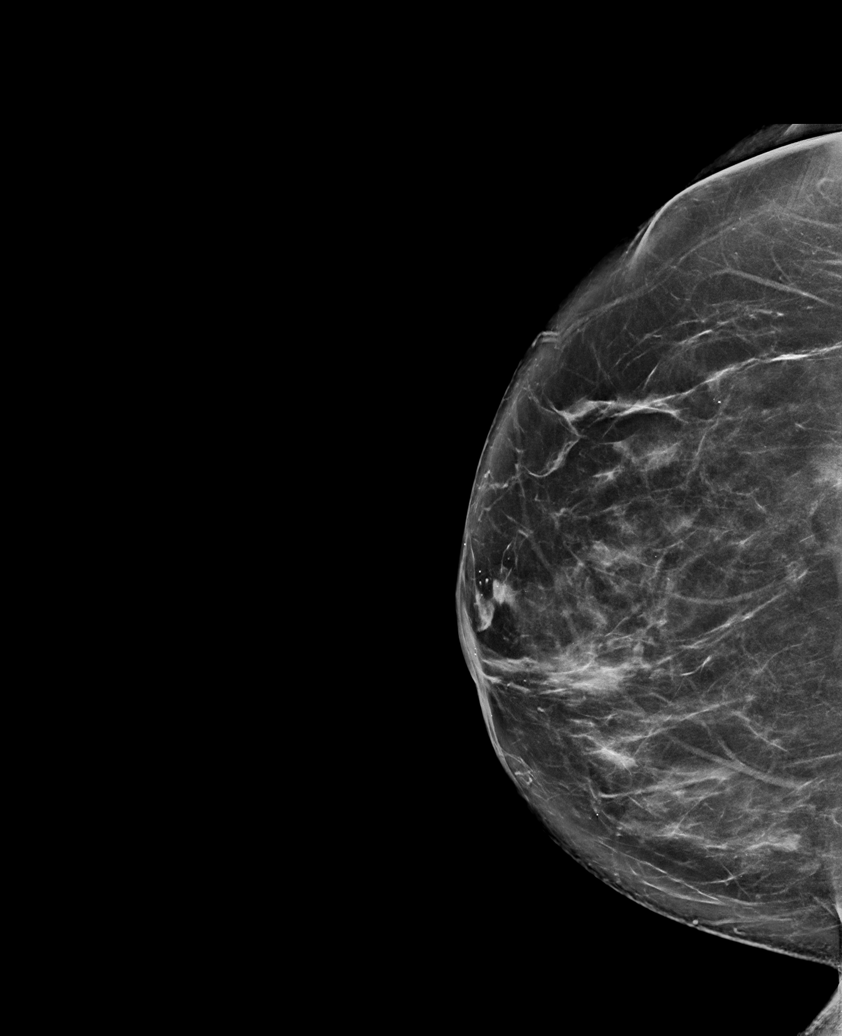

[R ML synth-2D]
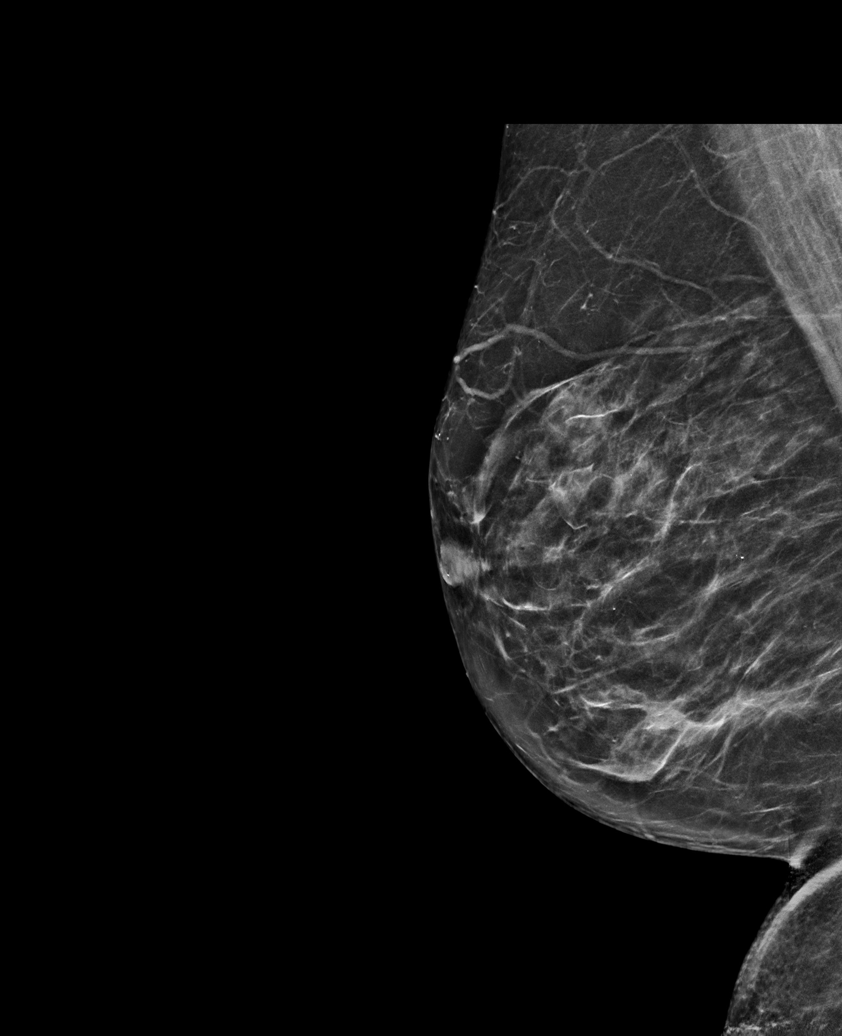

[L ML synth-2D]
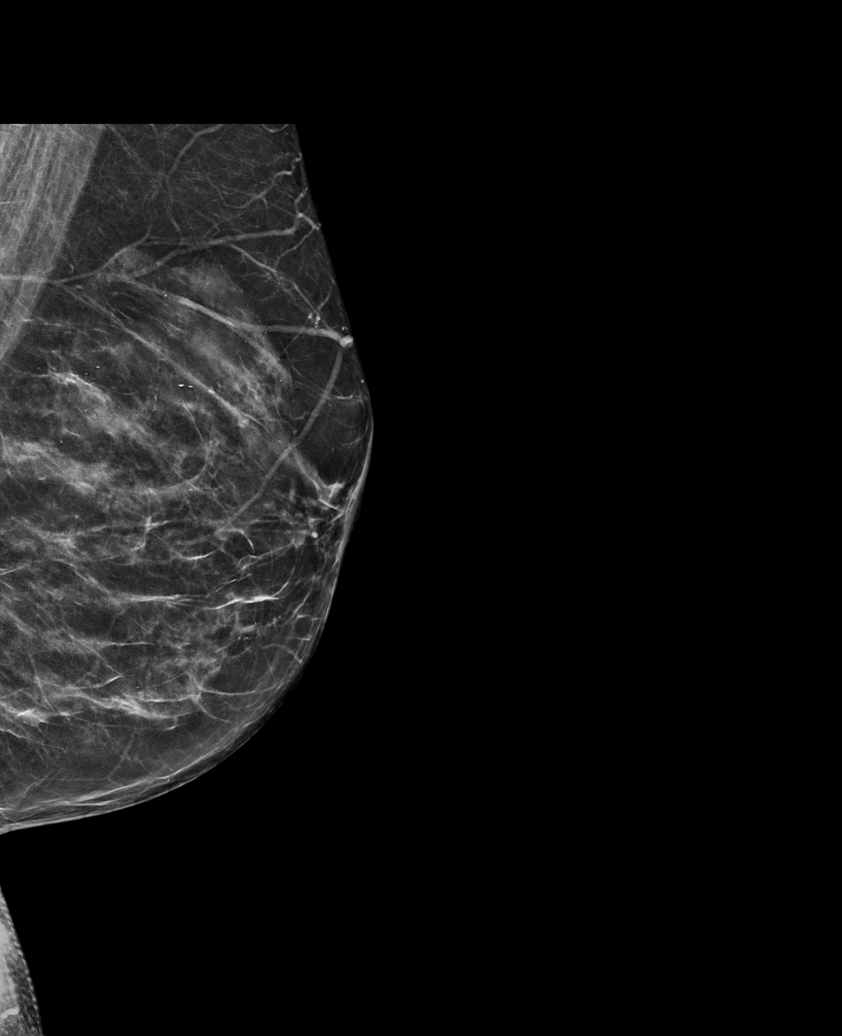

[6 of 36 positions shown; findings below may reference images not displayed]

ACR Breast Density Category c: The breast tissue is heterogeneously
dense, which may obscure small masses.
FINDINGS: Lateral view of bilateral breasts, spot compression cc and MLO views
of the left breast, spot compression right MLO view are submitted. A
repeat right cc view is also submitted. The previously questioned
asymmetries in both breasts do not persist on additional views. No
suspicious abnormality is identified bilaterally.
IMPRESSION: Benign findings.

RECOMMENDATION:
Routine screening mammogram back on schedule.

I have discussed the findings and recommendations with the patient.
If applicable, a reminder letter will be sent to the patient
regarding the next appointment.

BI-RADS CATEGORY  2: Benign.

## 2023-02-16 ENCOUNTER — Other Ambulatory Visit: Payer: Self-pay

## 2023-02-16 DIAGNOSIS — E782 Mixed hyperlipidemia: Secondary | ICD-10-CM

## 2023-02-16 MED ORDER — ATORVASTATIN CALCIUM 20 MG PO TABS: 20.0000 mg | ORAL_TABLET | Freq: Every day | ORAL | 1 refills | Status: AC

## 2023-02-18 ENCOUNTER — Encounter: Payer: Self-pay | Admitting: Gastroenterology

## 2023-02-18 ENCOUNTER — Ambulatory Visit: Payer: Medicare HMO | Admitting: Gastroenterology

## 2023-02-18 VITALS — BP 122/80 | HR 111 | Ht 62.0 in | Wt 136.0 lb

## 2023-02-18 DIAGNOSIS — R1319 Other dysphagia: Secondary | ICD-10-CM | POA: Diagnosis not present

## 2023-02-18 DIAGNOSIS — R079 Chest pain, unspecified: Secondary | ICD-10-CM

## 2023-02-18 NOTE — Patient Instructions (Signed)
You have been scheduled for an endoscopy. Please follow written instructions given to you at your visit today.  If you use inhalers (even only as needed), please bring them with you on the day of your procedure.  If you take any of the following medications, they will need to be adjusted prior to your procedure:   DO NOT TAKE 7 DAYS PRIOR TO TEST- Trulicity (dulaglutide) Ozempic, Wegovy (semaglutide) Mounjaro (tirzepatide) Bydureon Bcise (exanatide extended release)  DO NOT TAKE 1 DAY PRIOR TO YOUR TEST Rybelsus (semaglutide) Adlyxin (lixisenatide) Victoza (liraglutide) Byetta (exanatide) ___________________________________________________________________________    _______________________________________________________  If your blood pressure at your visit was 140/90 or greater, please contact your primary care physician to follow up on this.  _______________________________________________________  If you are age 34 or older, your body mass index should be between 23-30. Your Body mass index is 24.87 kg/m. If this is out of the aforementioned range listed, please consider follow up with your Primary Care Provider.  If you are age 47 or younger, your body mass index should be between 19-25. Your Body mass index is 24.87 kg/m. If this is out of the aformentioned range listed, please consider follow up with your Primary Care Provider.   ________________________________________________________  The [facility] providers would like to encourage you to use [facility] to communicate with providers for non-urgent requests or questions.  Due to long hold times on the telephone, sending your provider a message by [facility] may be a faster and more efficient way to get a response.  Please allow 48 business hours for a response.  Please remember that this is for non-urgent requests.  _______________________________________________________

## 2023-02-18 NOTE — Progress Notes (Signed)
HPI : Ashley Harding is a 57 y.o. female with a history of bipolar disorder, HTN and HLP who is referred to [place] by Loyola Mast, MD for further evaluation of noncardiac chest pain.  The patient states she started having this pain in February.  It comes and goes, but is experienced several days a week.  It can last for hours at a time.  It is described as a burning quality.  It is not necessarily associated with meals and she has not noticed any pattern to the pain in terms of time of day.  She is not awakened from sleep by the pain. She denies acid regurgitation but does experience excessive phlegm and cough, more often in the morning.  She sometimes feels like food gets stuck in her chest when she swallows.  No episodes of having to forcefully vomit stuck food. She has been taking Pepcid for about 2 months now, and says this has helped a little, but her symptoms continue.  Her maternal grandfather was diagnosed with stomach cancer in his 73s.  She was seen by cardiology for this pain and underwent a CT-C which showed mild nonobstructive CAD, and her pain was not felt to be cardiac.   CT-coronary Mild nonobstructive cAD, consider noncardiac causes of CP  Colonoscopy February 06, 2022 (Dr. Tomasa Rand)  12 mm polyp in AC (SSP) 6 mm polyp HF (SSP) 4 mm polyp sigmoid (HP) 2 polyps in sigmoid 3-6 mm, partial retrieval (HP)   Past Medical History:  Diagnosis Date   Bipolar 1 disorder (HCC)    GERD (gastroesophageal reflux disease)    Hyperlipidemia    Hypertension      Past Surgical History:  Procedure Laterality Date   BREAST REDUCTION SURGERY Bilateral    COLONOSCOPY  02/06/2022   RECONSTRUCTION OF NOSE     REDUCTION MAMMAPLASTY     Family History  Problem Relation Age of Onset   Hyperlipidemia Mother    Hypertension Mother    Stroke Mother    Cancer Father        Skin   Hypertension Father    Hyperlipidemia Father    Stroke Maternal Grandmother    Stomach cancer Maternal  Grandfather    Cancer Maternal Grandfather        Stomach   Heart disease Paternal Grandfather    Colon polyps Neg Hx    Colon cancer Neg Hx    Esophageal cancer Neg Hx    Rectal cancer Neg Hx    Social History   Tobacco Use   Smoking status: Every Day    Current packs/day: 1.50    Types: Cigarettes   Smokeless tobacco: Never  Vaping Use   Vaping status: Never Used  Substance Use Topics   Alcohol use: Yes    Comment: wine glass or two nightly   Drug use: Never   Current Outpatient Medications  Medication Sig Dispense Refill   atorvastatin (LIPITOR) 20 MG tablet Take 1 tablet (20 mg total) by mouth daily. 90 tablet 1   ciclopirox (PENLAC) 8 % solution Apply topically at bedtime. Apply over nail and surrounding skin. Apply daily over previous coat. After seven (7) days, may remove with alcohol and continue cycle. 6.6 mL 0   clobetasol ointment (TEMOVATE) 0.05 % Apply 1 Application topically at bedtime. 30 g 2   famotidine (PEPCID) 20 MG tablet Take 1 tablet (20 mg total) by mouth daily. 30 tablet 2   fluticasone (FLONASE) 50 MCG/ACT nasal spray  Place 1 spray into both nostrils daily. 16 g 2   fluticasone-salmeterol (ADVAIR) 250-50 MCG/ACT AEPB Inhale 1 puff into the lungs in the morning and at bedtime. 60 each 2   risperiDONE (RISPERDAL) 1 MG tablet Take 1 tablet by mouth 2 (two) times daily.     sertraline (ZOLOFT) 50 MG tablet Take 50 mg by mouth daily.     Vitamin D, Ergocalciferol, (DRISDOL) 1.25 MG (50000 UNIT) CAPS capsule Take 1 capsule (50,000 Units total) by mouth once a week. 5 capsule 0   No current facility-administered medications for this visit.   No Known Allergies   Review of Systems: All systems reviewed and negative except where noted in HPI.    No results found.  Physical Exam: BP 122/80   Pulse (!) 111   Ht 5\' 2"  (1.575 m)   Wt 136 lb (61.7 kg)   BMI 24.87 kg/m  Constitutional: Pleasant,well-developed, Caucasian female in no acute  distress. HEENT: Normocephalic and atraumatic. Conjunctivae are normal. No scleral icterus. Neck supple.  Cardiovascular: Normal rate, regular rhythm.  Pulmonary/chest: Effort normal and breath sounds normal. No wheezing, rales or rhonchi. Abdominal: Soft, nondistended, nontender. Bowel sounds active throughout. There are no masses palpable. No hepatomegaly. Extremities: no edema Neurological: Alert and oriented to person place and time. Skin: Skin is warm and dry. No rashes noted. Psychiatric: Normal mood and affect. Behavior is normal.  CBC No results found for: "WBC", "RBC", "HGB", "HCT", "PLT", "MCV", "MCH", "MCHC", "RDW", "LYMPHSABS", "MONOABS", "EOSABS", "BASOSABS"  CMP     Component Value Date/Time   NA 143 10/23/2022 1503   K 5.0 10/23/2022 1503   CL 104 10/23/2022 1503   CO2 22 10/23/2022 1503   GLUCOSE 89 10/23/2022 1503   GLUCOSE 82 08/15/2021 0808   BUN 11 10/23/2022 1503   CREATININE 0.89 10/23/2022 1503   CALCIUM 9.8 10/23/2022 1503   PROT 6.9 06/12/2022 1104   ALBUMIN 4.4 06/12/2022 1104   AST 32 06/12/2022 1104   ALT 13 06/12/2022 1104   ALKPHOS 97 06/12/2022 1104   BILITOT 0.5 06/12/2022 1104        No data to display            ASSESSMENT AND PLAN:  57 year old female with bipolar disorder, HTN, HLP with several months of burning chest pain and occasional dysphagia, not relieved with H2RA therapy.  She has phlegm and cough but no acid regurgitation.  GERD very likely.  Will proceed with EGD with possible dilation.  Tentatively switch to PPI following her EGD  Chest pain, dysphagia - EGD  The details, risks (including bleeding, perforation, infection, missed lesions, medication reactions and possible hospitalization or surgery if complications occur), benefits, and alternatives to EGD with possible biopsy and possible dilation were discussed with the patient and she consents to proceed.   [name] E. Tomasa Rand, MD [facility]   CC:   Loyola Mast, MD

## 2023-02-24 ENCOUNTER — Encounter: Payer: Self-pay | Admitting: Family Medicine

## 2023-02-24 ENCOUNTER — Ambulatory Visit (INDEPENDENT_AMBULATORY_CARE_PROVIDER_SITE_OTHER): Payer: Medicare HMO | Admitting: Family Medicine

## 2023-02-24 VITALS — Temp 98.0°F | Ht 62.0 in | Wt 135.4 lb

## 2023-02-24 DIAGNOSIS — Z8619 Personal history of other infectious and parasitic diseases: Secondary | ICD-10-CM | POA: Insufficient documentation

## 2023-02-24 DIAGNOSIS — R197 Diarrhea, unspecified: Secondary | ICD-10-CM | POA: Diagnosis not present

## 2023-02-24 DIAGNOSIS — Z1231 Encounter for screening mammogram for malignant neoplasm of breast: Secondary | ICD-10-CM

## 2023-02-24 DIAGNOSIS — E559 Vitamin D deficiency, unspecified: Secondary | ICD-10-CM

## 2023-02-24 NOTE — Assessment & Plan Note (Signed)
At risk for recurrence. We will test today.

## 2023-02-24 NOTE — Assessment & Plan Note (Signed)
I recommend we repeat her levels to determine if she can switch to maintenance therapy.

## 2023-02-24 NOTE — Progress Notes (Signed)
[facility] [address] [place] [address] Dept: [phone] Dept Fax: [phone]  Office Visit  Subjective:    Patient ID: Ashley Harding, female    DOB: Jan 20, 1966, 57 y.o..   MRN: [id number]  Chief Complaint  Patient presents with   Diarrhea    C/o having diarrhea x4 days.   No OTC meds taken.    History of Present Illness:  Patient is in today complaining of a 4-day history of diarrhea. She has been having multiple watery stools per day. She was diagnosed last winter with C. difficile colitis. She was treated with a course of vancomycin and this resolved well. She admits to having some fear about recurrence. She is engaged with gastroenterology and is having an endoscopy/colonoscopy in the next few weeks. Ms. Adamik has not taken any medication for her diarrhea at this point. She admits it is slightly more solid today.  Ms. Eckford has a history of Vitamin D deficiency. She has been on Vitamin D replacement.  Past Medical History: Patient Active Problem List   Diagnosis Date Noted   Lichen sclerosus et atrophicus of the vulva 10/23/2022   Allergic rhinitis due to pollen 10/23/2022   Clostridium difficile diarrhea 10/12/2022   Chest pain 09/30/2022   Chronic cough 09/30/2022   Laryngopharyngeal reflux (LPR) 07/21/2022   COPD (chronic obstructive pulmonary disease) (HCC) 07/21/2022   Aortic atherosclerosis (HCC) 01/14/2022   Coronary artery calcification seen on CT scan 01/14/2022   Emphysema lung (HCC) 01/14/2022   Hyperlipidemia 08/15/2021   Bipolar disorder, in full remission, most recent episode depressed (HCC) 01/15/2016   Vitamin D deficiency 11/28/2015   Postconcussion syndrome 11/27/2015   Tobacco use disorder 11/27/2015   Past Surgical History:  Procedure Laterality Date   BREAST REDUCTION SURGERY Bilateral    COLONOSCOPY  02/06/2022   RECONSTRUCTION OF NOSE     REDUCTION MAMMAPLASTY     Family  History  Problem Relation Age of Onset   Hyperlipidemia Mother    Hypertension Mother    Stroke Mother    Cancer Father        Skin   Hypertension Father    Hyperlipidemia Father    Stroke Maternal Grandmother    Stomach cancer Maternal Grandfather    Cancer Maternal Grandfather        Stomach   Heart disease Paternal Grandfather    Colon polyps Neg Hx    Colon cancer Neg Hx    Esophageal cancer Neg Hx    Rectal cancer Neg Hx    Outpatient Medications Prior to Visit  Medication Sig Dispense Refill   famotidine (PEPCID) 20 MG tablet Take 1 tablet (20 mg total) by mouth daily. 30 tablet 2   fluticasone (FLONASE) 50 MCG/ACT nasal spray Place 1 spray into both nostrils daily. 16 g 2   risperiDONE (RISPERDAL) 1 MG tablet Take 1 tablet by mouth 2 (two) times daily.     sertraline (ZOLOFT) 50 MG tablet Take 50 mg by mouth daily.     atorvastatin (LIPITOR) 20 MG tablet Take 1 tablet (20 mg total) by mouth daily. 90 tablet 1   fluticasone-salmeterol (ADVAIR) 250-50 MCG/ACT AEPB Inhale 1 puff into the lungs in the morning and at bedtime. (Patient not taking: Reported on 02/24/2023) 60 each 2   Vitamin D, Ergocalciferol, (DRISDOL) 1.25 MG (50000 UNIT) CAPS capsule Take 1 capsule (50,000 Units total) by mouth once a week. (Patient not taking: Reported on 02/24/2023) 5 capsule 0  ciclopirox (PENLAC) 8 % solution Apply topically at bedtime. Apply over nail and surrounding skin. Apply daily over previous coat. After seven (7) days, may remove with alcohol and continue cycle. (Patient not taking: Reported on 02/18/2023) 6.6 mL 0   clobetasol ointment (TEMOVATE) 0.05 % Apply 1 Application topically at bedtime. (Patient not taking: Reported on 02/18/2023) 30 g 2   No facility-administered medications prior to visit.   No Known Allergies   Objective:   Today's Vitals   02/24/23 1415  Temp: 98 F (36.7 C)  TempSrc: Temporal  Weight: 135 lb 6.4 oz (61.4 kg)  Height: 5\' 2"  (1.575 m)   Body mass  index is 24.76 kg/m.   General: Well developed, well nourished. No acute distress. Psych: Alert and oriented. Normal mood and affect.  There are no preventive care reminders to display for this patient.    Assessment & Plan:   Problem List Items Addressed This Visit       Other   History of Clostridioides difficile infection    At risk for recurrence. We will test today.      Relevant Orders   C. difficile GDH and Toxin A/B   Vitamin D deficiency    I recommend we repeat her levels to determine if she can switch to maintenance therapy.      Relevant Orders   VITAMIN D 25 Hydroxy (Vit-D Deficiency, Fractures)   Other Visit Diagnoses     Acute diarrhea    -  Primary   In light of past C. difficile, I will go ahead and test her for this. In the menatime, I recommend she use Imodium for management.   Relevant Orders   C. difficile GDH and Toxin A/B   Encounter for screening mammogram for malignant neoplasm of breast       Relevant Orders   MM DIGITAL SCREENING BILATERAL       Return in about 6 months (around 08/27/2023) for Reassessment.   Loyola Mast, MD

## 2023-02-25 ENCOUNTER — Other Ambulatory Visit: Payer: Medicare HMO

## 2023-02-25 DIAGNOSIS — R197 Diarrhea, unspecified: Secondary | ICD-10-CM | POA: Diagnosis not present

## 2023-02-25 DIAGNOSIS — Z8619 Personal history of other infectious and parasitic diseases: Secondary | ICD-10-CM

## 2023-02-25 LAB — VITAMIN D 25 HYDROXY (VIT D DEFICIENCY, FRACTURES): VITD: 40.28 ng/mL (ref 30.00–100.00)

## 2023-02-25 NOTE — Addendum Note (Signed)
Addended by: Lindley Magnus L on: 02/25/2023 01:45 PM   Modules accepted: Orders

## 2023-02-26 LAB — C. DIFFICILE GDH AND TOXIN A/B: SPECIMEN QUALITY:: ADEQUATE

## 2023-03-09 ENCOUNTER — Encounter: Payer: Self-pay | Admitting: Gastroenterology

## 2023-03-18 ENCOUNTER — Ambulatory Visit: Payer: Medicare HMO | Admitting: Gastroenterology

## 2023-03-18 ENCOUNTER — Encounter: Payer: Self-pay | Admitting: Gastroenterology

## 2023-03-18 VITALS — BP 110/76 | HR 77 | Temp 98.0°F | Resp 18 | Ht 62.0 in | Wt 136.0 lb

## 2023-03-18 DIAGNOSIS — R1310 Dysphagia, unspecified: Secondary | ICD-10-CM | POA: Diagnosis not present

## 2023-03-18 DIAGNOSIS — R079 Chest pain, unspecified: Secondary | ICD-10-CM

## 2023-03-18 DIAGNOSIS — K2100 Gastro-esophageal reflux disease with esophagitis, without bleeding: Secondary | ICD-10-CM | POA: Diagnosis not present

## 2023-03-18 DIAGNOSIS — K2970 Gastritis, unspecified, without bleeding: Secondary | ICD-10-CM

## 2023-03-18 DIAGNOSIS — R1319 Other dysphagia: Secondary | ICD-10-CM

## 2023-03-18 MED ORDER — OMEPRAZOLE 20 MG PO CPDR: 20.0000 mg | DELAYED_RELEASE_CAPSULE | Freq: Every day | ORAL | 0 refills | Status: AC

## 2023-03-18 MED ORDER — SODIUM CHLORIDE 0.9 % IV SOLN: 500.0000 mL | Freq: Once | INTRAVENOUS | Status: DC

## 2023-03-18 NOTE — Progress Notes (Signed)
Called to room to assist during endoscopic procedure.  Patient ID and intended procedure confirmed with present staff. Received instructions for my participation in the procedure from the performing physician.  

## 2023-03-18 NOTE — Progress Notes (Signed)
History and Physical Interval Note:  03/18/2023 9:07 AM  Ashley Harding  has presented today for endoscopic procedure(s), with the diagnosis of  Encounter Diagnosis  Name Primary?   Chest pain, unspecified type Yes  .  The various methods of evaluation and treatment have been discussed with the patient and/or family. After consideration of risks, benefits and other options for treatment, the patient has consented to  the endoscopic procedure(s).   The patient's history has been reviewed, patient examined, no change in status, stable for endoscopic procedure(s).  I have reviewed the patient's chart and labs.  Questions were answered to the patient's satisfaction.     Terease Marcotte E. Tomasa Rand, MD [facility]enter[facility]

## 2023-03-18 NOTE — Patient Instructions (Addendum)
Take your new medication as ordered 1/2 hour before eating.  Do not take it on a full stomach.  IT won't work.  Resume all of your regular medications today as ordered.  Abide by your special diet.  See diet area.  Try to get into see your Pulmonary Dr. ASAP for your CPAP as you have sleep apnea.  YOU HAD AN ENDOSCOPIC PROCEDURE TODAY AT THE [facility]:   Refer to the procedure report that was given to you for any specific questions about what was found during the examination.  If the procedure report does not answer your questions, please call your gastroenterologist to clarify.  If you requested that your care partner not be given the details of your procedure findings, then the procedure report has been included in a sealed envelope for you to review at your convenience later.  YOU SHOULD EXPECT: Some feelings of bloating in the abdomen. Passage of more gas than usual.  Walking can help get rid of the air that was put into your GI tract during the procedure and reduce the bloating.  Please Note:  You might notice some irritation and congestion in your nose or some drainage.  This is from the oxygen used during your procedure.  There is no need for concern and it should clear up in a day or so.  SYMPTOMS TO REPORT IMMEDIATELY:   Following upper endoscopy (EGD)  Vomiting of blood or coffee ground material  New chest pain or pain under the shoulder blades  Painful or persistently difficult swallowing  New shortness of breath  Fever of 100F or higher  Black, tarry-looking stools  For urgent or emergent issues, a gastroenterologist can be reached at any hour by calling [phone]. Do not use MyChart messaging for urgent concerns.    DIET:  We do suggest  Notjhing by mouth until 10:45 a m.  From 10:45 to 11:45 clear lliquids, then a soft dirt for the rest of today.    You may proceed to your regular diet tomorrow.  Drink plenty of fluids but you should avoid alcoholic beverages  for 24 hours.  ACTIVITY:  You should plan to take it easy for the rest of today and you should NOT DRIVE or use heavy machinery until tomorrow (because of the sedation medicines used during the test).    FOLLOW UP: Our staff will call the number listed on your records the next business day following your procedure.  We will call around 7:15- 8:00 am to check on you and address any questions or concerns that you may have regarding the information given to you following your procedure. If we do not reach you, we will leave a message.     If any biopsies were taken you will be contacted by phone or by letter within the next 1-3 weeks.  Please call us at [phone] if you have not heard about the biopsies in 3 weeks.    SIGNATURES/CONFIDENTIALITY: You and/or your care partner have signed paperwork which will be entered into your electronic medical record.  These signatures attest to the fact that that the information above on your After Visit Summary has been reviewed and is understood.  Full responsibility of the confidentiality of this discharge information lies with you and/or your care-partner.

## 2023-03-18 NOTE — Op Note (Signed)
[facility] Patient Name: Ashley Harding Procedure Date: 03/18/2023 9:14 AM MRN: [id number] Endoscopist: Lorin Picket E. Tomasa Rand , MD, [id number] Age: 57 Referring MD:  Date of Birth: 1966/05/21 Gender: Female Account #: [id number] Procedure:                Upper GI endoscopy Indications:              Dysphagia, Chest pain (non cardiac) Medicines:                Monitored Anesthesia Care Procedure:                Pre-Anesthesia Assessment:                           - Prior to the procedure, a History and Physical                            was performed, and patient medications and                            allergies were reviewed. The patient's tolerance of                            previous anesthesia was also reviewed. The risks                            and benefits of the procedure and the sedation                            options and risks were discussed with the patient.                            All questions were answered, and informed consent                            was obtained. Prior Anticoagulants: The patient has                            taken no anticoagulant or antiplatelet agents. ASA                            Grade Assessment: III - A patient with severe                            systemic disease. After reviewing the risks and                            benefits, the patient was deemed in satisfactory                            condition to undergo the procedure.                           After obtaining informed consent, the endoscope was  passed under direct vision. Throughout the                            procedure, the patient's blood pressure, pulse, and                            oxygen saturations were monitored continuously. The                            Olympus Scope [id number] was introduced through the                            mouth, and advanced to the second part of duodenum.                            The  upper GI endoscopy was accomplished without                            difficulty. The patient tolerated the procedure                            fairly well, but experienced upper airway                            obstruction resulting in transient oxygen                            saturations in the 50s. This prompted removal of                            the endoscope and jaw thrust with prompt                            improvement of oxygen saturation. The endoscope was                            reinserted and the procedure was completed with no                            further desaturations. Scope In: Scope Out: Findings:                 The examined portions of the nasopharynx,                            oropharynx and larynx were normal.                           LA Grade A (one or more mucosal breaks less than 5                            mm, not extending between tops of 2 mucosal folds)  esophagitis with no bleeding was found at the                            gastroesophageal junction.                           The exam of the esophagus was otherwise normal.                           A guidewire was placed and the scope was withdrawn.                            Dilation was performed at the gastroesophageal                            junction with a Savary dilator with moderate                            resistance at 17 mm.                           Localized moderate mucosal changes characterized by                            beefy erythema were found in the prepyloric region                            of the stomach. Biopsies were taken with a cold                            forceps for histology. Estimated blood loss was                            minimal.                           Diffuse mild inflammation characterized by erythema                            and granularity was found in the gastric body and                            in the  gastric antrum. Biopsies were taken with a                            cold forceps for Helicobacter pylori testing.                            Estimated blood loss was minimal.                           The gastroesophageal flap valve was visualized                            endoscopically and  classified as Hill Grade IV (no                            fold, wide open lumen, hiatal hernia present).                           The examined duodenum was normal. Complications:            No immediate complications. Estimated Blood Loss:     Estimated blood loss was minimal. Impression:               - The examined portions of the nasopharynx,                            oropharynx and larynx were normal.                           - LA Grade A reflux esophagitis with no bleeding.                           - Erythematous mucosa in the prepyloric region of                            the stomach. Biopsied. I suspect this is related to                            prolapse of redundant gastric mucosa through the                            pylorus, but biopsied to exclude malignancy.                           - Gastritis. Biopsied.                           - Gastroesophageal flap valve classified as Hill                            Grade IV (no fold, wide open lumen, hiatal hernia                            present).                           - Normal examined duodenum.                           - Dilation performed at the gastroesophageal                            junction with no obvious post dilation effect. Recommendation:           - Patient has a contact number available for                            emergencies. The signs and symptoms of potential  delayed complications were discussed with the                            patient. Return to normal activities tomorrow.                            Written discharge instructions were provided to the                             patient.                           - Resume previous diet.                           - Continue present medications.                           - Await pathology results.                           - Take omeprazole 20 mg PO daily for 4 weeks. Armarion Greek E. Tomasa Rand, MD 03/18/2023 9:44:49 AM This report has been signed electronically.

## 2023-03-18 NOTE — Progress Notes (Signed)
Sedate, gd SR, tolerated procedure well, VSS, report to RN 

## 2023-03-19 ENCOUNTER — Encounter: Payer: Self-pay | Admitting: Family Medicine

## 2023-03-19 ENCOUNTER — Telehealth: Payer: Self-pay

## 2023-03-19 ENCOUNTER — Telehealth: Payer: Self-pay | Admitting: Pulmonary Disease

## 2023-03-19 DIAGNOSIS — R0683 Snoring: Secondary | ICD-10-CM

## 2023-03-19 DIAGNOSIS — G4733 Obstructive sleep apnea (adult) (pediatric): Secondary | ICD-10-CM

## 2023-03-19 NOTE — Telephone Encounter (Signed)
Left HIPAA compliant voicemail. 

## 2023-03-23 NOTE — Progress Notes (Signed)
Ashley Harding,  The biopsies taken from your stomach were notable for mild reactive gastropathy which is a common finding and often related to use of certain medications (usually NSAIDs), but there was no evidence of Helicobacter pylori infection. This common finding is not felt to necessarily be a cause of any particular symptom and there is no specific treatment or further evaluation recommended.  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  This finding is thought to represent an increased risk of stomach cancer in some patients.  Patients at highest risk are those patients with a family history of stomach cancer and those with a smoking history, as well as patients who are from areas with high rates of stomach cancer. Periodic surveillance (repeat endoscopy with biopsies) is often performed to monitor for development of cancer or further precancerous changes. I would recommend a repeat upper endoscopy with gastric mapping biopsies in 1 year.  Please take the omeprazole daily as previously recommended to see if this helps your chest discomfort and swallowing difficulties.

## 2023-03-24 ENCOUNTER — Other Ambulatory Visit: Payer: Self-pay | Admitting: Family Medicine

## 2023-03-24 DIAGNOSIS — Z1231 Encounter for screening mammogram for malignant neoplasm of breast: Secondary | ICD-10-CM

## 2023-03-24 NOTE — Telephone Encounter (Signed)
Please advise settings for cpap.

## 2023-03-26 ENCOUNTER — Ambulatory Visit: Admission: RE | Admit: 2023-03-26 | Payer: Medicare HMO | Source: Ambulatory Visit

## 2023-03-26 DIAGNOSIS — Z1231 Encounter for screening mammogram for malignant neoplasm of breast: Secondary | ICD-10-CM

## 2023-04-02 DIAGNOSIS — Z1231 Encounter for screening mammogram for malignant neoplasm of breast: Secondary | ICD-10-CM

## 2023-04-20 NOTE — Telephone Encounter (Signed)
She has severe obstructive sleep apnea with an AHI of 31 and SpO2 low of 44%, but she only had minimal time with SpO2 < 80% so I think most of the issue is sleep apnea and not nocturnal hypoxemia.  I would recommend starting auto-titrating CPAP therapy 5-15cmH2O with humidification and mask fitting session. Please send DME order.  Thanks, JD

## 2023-04-22 NOTE — Telephone Encounter (Signed)
CPAP order placed

## 2023-05-27 DIAGNOSIS — G4733 Obstructive sleep apnea (adult) (pediatric): Secondary | ICD-10-CM | POA: Diagnosis not present

## 2023-06-21 ENCOUNTER — Telehealth: Payer: Self-pay

## 2023-06-21 NOTE — Telephone Encounter (Signed)
Received a request for patients labs for a CMP & CBC.  She had a BMP don on 10/23/22, printed and faxed back to them at [phone]. Dm/cma

## 2023-06-25 ENCOUNTER — Encounter: Payer: Self-pay | Admitting: Family Medicine

## 2023-06-27 DIAGNOSIS — G4733 Obstructive sleep apnea (adult) (pediatric): Secondary | ICD-10-CM | POA: Diagnosis not present

## 2023-07-27 DIAGNOSIS — G4733 Obstructive sleep apnea (adult) (pediatric): Secondary | ICD-10-CM | POA: Diagnosis not present

## 2023-08-10 DIAGNOSIS — H5213 Myopia, bilateral: Secondary | ICD-10-CM | POA: Diagnosis not present

## 2023-08-24 ENCOUNTER — Ambulatory Visit: Payer: Medicare HMO | Admitting: Pulmonary Disease

## 2023-08-24 ENCOUNTER — Encounter: Payer: Self-pay | Admitting: Pulmonary Disease

## 2023-08-24 VITALS — BP 128/78 | HR 102

## 2023-08-24 DIAGNOSIS — K219 Gastro-esophageal reflux disease without esophagitis: Secondary | ICD-10-CM

## 2023-08-24 DIAGNOSIS — J449 Chronic obstructive pulmonary disease, unspecified: Secondary | ICD-10-CM | POA: Diagnosis not present

## 2023-08-24 DIAGNOSIS — G4733 Obstructive sleep apnea (adult) (pediatric): Secondary | ICD-10-CM

## 2023-08-24 DIAGNOSIS — F17210 Nicotine dependence, cigarettes, uncomplicated: Secondary | ICD-10-CM | POA: Diagnosis not present

## 2023-08-24 DIAGNOSIS — J432 Centrilobular emphysema: Secondary | ICD-10-CM

## 2023-08-24 NOTE — Progress Notes (Unsigned)
Synopsis: Referred in March 2024 for COPD by Herbie Drape, MD  Subjective:   PATIENT ID: Ashley Harding GENDER: female DOB: 10-30-1965, MRN: [id number]  HPI  Chief Complaint  Patient presents with   Follow-up    Pt is here for cpap f/u, pt states she has had good & bad nights using cpap    Ashley Harding is a 58 year old woman, daily smoker with history of bipolar disorder, GERD, HLD and hypertension who returns to pulmonary clinic for COPD.   Initial OV 10/21/22 She was seen by her PCP 10/06/22, note reviewed, where she complained of chronic left chest pain that waxes/wanes and worsens with exertion while at work. She also has persistent cough which prompted referral to our office. The chest pain is dull. She thought was related to heart burn and has been taking TUMs as needed which has helped. She doesn't think she has shortness of breath. Denies wheezing. She has cough with mucous production. She has sinus congestion and post-nasal drainage. She has seasonal allergies. She will take benedryl as needed. She does not use nasal sprays.   She reports history of snoring. She had a colonoscopy last summer and anesthesiologist was concerned for sleep apnea.   She is enrolled in lung cancer screening, last scan 01/2022 without suspicious nodules. Mild diffuse bronchial wall thickening and very mild centrilobular and paraseptal emphysema. Mild scarring in the medial segment of RML and medial aspect of lingula.   She started smoking in college and then quit at age [age] years. She has been smoking close to 20 years 1-2 packs per day. She works as a Soil scientist. No family history of lung disease.    12/31/22  She was started on advair 250-59mcg 1 puff twice daily, famotidine 20mg  daily for GERD and fluticasone nasal spray for post-nasal drainage.  Her cough symptoms have   PFTs today show moderate obstruction with significant bronchodilator response.   Will trial her on advair 250-64mcg 1 puff  twice daily and monitor for improvement in the cough.  She is to start famotidine 20mg  daily for GERD.  She is to start fluticasone nasal spray for post-nasal drainage.  Completed home sleep study, awaiting results.  OV 08/24/23 She returns for CPAP compliance visit.  She has been using the CPAP 30/30 days with 22 days (73%) with 4 hours or greater use. AHI is 3.   She has noticed improvement in her sleep and does feel more rested after using the CPAP. She has issues with keeping the mask on for more than 4 hours some nights due to cough from sinus drainage.   Past Medical History:  Diagnosis Date   Bipolar 1 disorder (HCC)    GERD (gastroesophageal reflux disease)    Hyperlipidemia    Hypertension    Sleep apnea      Family History  Problem Relation Age of Onset   Hyperlipidemia Mother    Hypertension Mother    Stroke Mother    Cancer Father        Skin   Hypertension Father    Hyperlipidemia Father    Stroke Maternal Grandmother    Stomach cancer Maternal Grandfather    Cancer Maternal Grandfather        Stomach   Heart disease Paternal Grandfather    Colon polyps Neg Hx    Colon cancer Neg Hx    Esophageal cancer Neg Hx    Rectal cancer Neg Hx    Breast cancer Neg  Hx      Social History   Socioeconomic History   Marital status: Significant Other    Spouse name: Not on file   Number of children: 1   Years of education: Not on file   Highest education level: Bachelor's degree (e.g., BA, AB, BS)  Occupational History   Occupation: Curator    Comment: [employer]- [place]  Tobacco Use   Smoking status: Every Day    Current packs/day: 1.50    Types: Cigarettes   Smokeless tobacco: Never   Tobacco comments:    1.5pk per day   Vaping Use   Vaping status: Never Used  Substance and Sexual Activity   Alcohol use: Yes    Comment: wine glass or two nightly   Drug use: Never   Sexual activity: Yes    Birth control/protection: Post-menopausal  Other  Topics Concern   Not on file  Social History Narrative   Not on file   Social Drivers of Health   Financial Resource Strain: Low Risk  (02/23/2023)   Overall Financial Resource Strain (CARDIA)    Difficulty of Paying Living Expenses: Not very hard  Food Insecurity: No Food Insecurity (02/23/2023)   Hunger Vital Sign    Worried About Running Out of Food in the Last Year: Never true    Ran Out of Food in the Last Year: Never true  Transportation Needs: No Transportation Needs (02/23/2023)   PRAPARE - Administrator, Civil Service (Medical): No    Lack of Transportation (Non-Medical): No  Physical Activity: Unknown (02/23/2023)   Exercise Vital Sign    Days of Exercise per Week: Patient declined    Minutes of Exercise per Session: Not on file  Stress: Stress Concern Present (02/23/2023)   [employer] of Occupational Health - Occupational Stress Questionnaire    Feeling of Stress : Rather much  Social Connections: Unknown (02/23/2023)   Social Connection and Isolation Panel [NHANES]    Frequency of Communication with Friends and Family: More than three times a week    Frequency of Social Gatherings with Friends and Family: Twice a week    Attends Religious Services: Patient declined    Database administrator or Organizations: Patient declined    Attends Engineer, structural: Not on file    Marital Status: Living with partner  Intimate Partner Violence: Not on file     No Known Allergies   Outpatient Medications Prior to Visit  Medication Sig Dispense Refill   atorvastatin (LIPITOR) 20 MG tablet Take 1 tablet (20 mg total) by mouth daily. 90 tablet 1   famotidine (PEPCID) 20 MG tablet Take 1 tablet (20 mg total) by mouth daily. 30 tablet 2   fluticasone (FLONASE) 50 MCG/ACT nasal spray Place 1 spray into both nostrils daily. 16 g 2   fluticasone-salmeterol (ADVAIR) 250-50 MCG/ACT AEPB Inhale 1 puff into the lungs in the morning and at bedtime. 60 each 2    omeprazole (PRILOSEC) 20 MG capsule Take 1 capsule (20 mg total) by mouth daily. 30 capsule 0   sertraline (ZOLOFT) 50 MG tablet Take 50 mg by mouth daily.     risperiDONE (RISPERDAL) 1 MG tablet Take 1 tablet by mouth 2 (two) times daily. (Patient not taking: Reported on 08/24/2023)     Vitamin D, Ergocalciferol, (DRISDOL) 1.25 MG (50000 UNIT) CAPS capsule Take 1 capsule (50,000 Units total) by mouth once a week. (Patient not taking: Reported on 08/24/2023) 5 capsule 0  No facility-administered medications prior to visit.   Review of Systems  Constitutional:  Negative for chills, fever, malaise/fatigue and weight loss.  HENT:  Positive for congestion. Negative for sinus pain and sore throat.   Eyes: Negative.   Respiratory:  Positive for sputum production. Negative for cough, hemoptysis, shortness of breath and wheezing.   Cardiovascular:  Negative for chest pain, palpitations, orthopnea, claudication and leg swelling.  Gastrointestinal:  Positive for heartburn. Negative for abdominal pain, nausea and vomiting.  Genitourinary: Negative.   Musculoskeletal:  Negative for joint pain and myalgias.  Skin:  Negative for rash.  Neurological:  Negative for weakness.  Endo/Heme/Allergies:  Positive for environmental allergies.  Psychiatric/Behavioral:  Positive for depression. The patient is nervous/anxious.    Objective:   Vitals:   08/24/23 1351  BP: 128/78  Pulse: (!) 102  SpO2: 98%   Physical Exam Constitutional:      General: She is not in acute distress.    Appearance: She is not ill-appearing.  HENT:     Head: Normocephalic and atraumatic.  Eyes:     General: No scleral icterus.    Conjunctiva/sclera: Conjunctivae normal.  Cardiovascular:     Rate and Rhythm: Normal rate and regular rhythm.     Pulses: Normal pulses.     Heart sounds: Normal heart sounds. No murmur heard. Pulmonary:     Effort: Pulmonary effort is normal.     Breath sounds: Normal breath sounds. No wheezing,  rhonchi or rales.  Musculoskeletal:     Right lower leg: No edema.     Left lower leg: No edema.  Skin:    General: Skin is warm and dry.  Neurological:     General: No focal deficit present.     Mental Status: She is alert.    CBC No results found for: "WBC", "RBC", "HGB", "HCT", "PLT", "MCV", "MCH", "MCHC", "RDW", "LYMPHSABS", "MONOABS", "EOSABS", "BASOSABS"   Chest imaging: CT Chest LCS 01/12/22 Mediastinum/Nodes: No pathologically enlarged mediastinal or hilar lymph nodes. Please note that accurate exclusion of hilar adenopathy is limited on noncontrast CT scans. Esophagus is unremarkable in appearance. No axillary lymphadenopathy.   Lungs/Pleura: No suspicious appearing pulmonary nodules or masses are noted. No acute consolidative airspace disease. No pleural effusions. Mild diffuse bronchial wall thickening with very mild centrilobular and paraseptal emphysema. Mild scarring noted in the medial segment of the right middle lobe and medial aspect of the lingula.   PFT:    Latest Ref Rng & Units 12/31/2022    8:46 AM  PFT Results  FVC-Pre L 2.36   FVC-Predicted Pre % 74   FVC-Post L 2.43   FVC-Predicted Post % 76   Pre FEV1/FVC % % 63   Post FEV1/FCV % % 70   FEV1-Pre L 1.49   FEV1-Predicted Pre % 60   FEV1-Post L 1.70   DLCO uncorrected ml/min/mmHg 17.27   DLCO UNC% % 90   DLCO corrected ml/min/mmHg 17.27   DLCO COR %Predicted % 90   DLVA Predicted % 109   TLC L 4.80   TLC % Predicted % 100   RV % Predicted % 134     Labs:  Path:  Echo:  Heart Catheterization:    Assessment & Plan:   No diagnosis found.  Discussion: Ashley Harding is a 58 year old woman, daily smoker with history of bipolar disorder, GERD, HLD and hypertension who returns to pulmonary clinic for COPD and OSA.   COPD She has mild centrilobular emphysema on her  CT chest scan along with cough and sputum production. She has moderate obstruction based on PFTs with significant  bronchodilator response and evidence of air trapping.  - continue advair 1 puff twice daily - use albuterol inhaler as needed  OSA - Continue CPAP nightly for 4 hours or more  Cigarette Smoker - Recommend nicotine replacement therapy - Use 21mg  nicotine patch daily - Use 2mg  mini nicotine lozenges as needed  GERD - continue famotidine 20mg  daily   Sinus Congestion - continue flonase 1 spray per nostril daily  Follow up in 1 year, call sooner if needed.  Melody Comas, MD [facility] Office: [phone]   Current Outpatient Medications:    atorvastatin (LIPITOR) 20 MG tablet, Take 1 tablet (20 mg total) by mouth daily., Disp: 90 tablet, Rfl: 1   famotidine (PEPCID) 20 MG tablet, Take 1 tablet (20 mg total) by mouth daily., Disp: 30 tablet, Rfl: 2   fluticasone (FLONASE) 50 MCG/ACT nasal spray, Place 1 spray into both nostrils daily., Disp: 16 g, Rfl: 2   fluticasone-salmeterol (ADVAIR) 250-50 MCG/ACT AEPB, Inhale 1 puff into the lungs in the morning and at bedtime., Disp: 60 each, Rfl: 2   omeprazole (PRILOSEC) 20 MG capsule, Take 1 capsule (20 mg total) by mouth daily., Disp: 30 capsule, Rfl: 0   sertraline (ZOLOFT) 50 MG tablet, Take 50 mg by mouth daily., Disp: , Rfl:    risperiDONE (RISPERDAL) 1 MG tablet, Take 1 tablet by mouth 2 (two) times daily. (Patient not taking: Reported on 08/24/2023), Disp: , Rfl:    Vitamin D, Ergocalciferol, (DRISDOL) 1.25 MG (50000 UNIT) CAPS capsule, Take 1 capsule (50,000 Units total) by mouth once a week. (Patient not taking: Reported on 08/24/2023), Disp: 5 capsule, Rfl: 0

## 2023-08-24 NOTE — Patient Instructions (Addendum)
Continue advair diskus inhaler 1 puff twice daily - rinse mouth out after each use   Continue pepcid 1 tab daily for heart burn   Continue fluticasone nasal spray, 1 spary per nostril for sinus congestion   Recommend nicotine replacement therapy with 21mg  nicotine patch per day and mini nicotine lozenges as needed. The patches and lozenges can be picked up over the counter at the pharmacy or Call 1-800-quit-NOW to get free nicotine replacement and counseling from the state of [place]   Continue CPAP nightly for at least 4 hours, your compliance report looks good.   Follow up in 1 year, call sooner if needed

## 2023-08-25 ENCOUNTER — Encounter: Payer: Self-pay | Admitting: Pulmonary Disease

## 2023-08-26 DIAGNOSIS — H524 Presbyopia: Secondary | ICD-10-CM | POA: Diagnosis not present

## 2023-08-27 DIAGNOSIS — G4733 Obstructive sleep apnea (adult) (pediatric): Secondary | ICD-10-CM | POA: Diagnosis not present

## 2023-09-27 ENCOUNTER — Ambulatory Visit: Payer: Medicare HMO | Admitting: Pulmonary Disease

## 2023-09-27 DIAGNOSIS — G4733 Obstructive sleep apnea (adult) (pediatric): Secondary | ICD-10-CM | POA: Diagnosis not present

## 2023-10-25 DIAGNOSIS — G4733 Obstructive sleep apnea (adult) (pediatric): Secondary | ICD-10-CM | POA: Diagnosis not present

## 2023-11-25 DIAGNOSIS — G4733 Obstructive sleep apnea (adult) (pediatric): Secondary | ICD-10-CM | POA: Diagnosis not present

## 2023-12-25 DIAGNOSIS — G4733 Obstructive sleep apnea (adult) (pediatric): Secondary | ICD-10-CM | POA: Diagnosis not present

## 2023-12-31 ENCOUNTER — Ambulatory Visit: Payer: Medicare HMO

## 2024-01-10 ENCOUNTER — Ambulatory Visit: Payer: Self-pay

## 2024-01-10 NOTE — Telephone Encounter (Signed)
 FYI Only or Action Required?: FYI only for provider  Patient was last seen in primary care on 02/24/2023 by Graig Lawyer, MD. Called Nurse Triage reporting Fall and Knee Pain. Symptoms began about a month ago. Interventions attempted: OTC medications: ibuprofen and Rest, hydration, or home remedies. Symptoms are: gradually worsening.  Triage Disposition: See PCP When Office is Open (Within 3 Days)  Patient/caregiver understands and will follow disposition?: Yes                  Copied from CRM [id number]. Topic: Clinical - Red Word Triage >> Jan 10, 2024 11:29 AM [place] A wrote: [facility] that prompted transfer to Nurse Triage: Patient fell about a month ago and her knees are very painful- mostly right knee that has pain going up and down leg and foot feels like its falling asleep Reason for Disposition  [1] MODERATE pain (e.g., interferes with normal activities, limping) AND [2] present > 3 days  Answer Assessment - Initial Assessment Questions 1. LOCATION and RADIATION: "Where is the pain located?"      Pt fell a month ago -- feet got tangled in an electrical cord, pt states she fell on both knees on the concrete and experienced immediate pain. Pain subsided a bit, now the pain is creeping up (worse on the R) 2. QUALITY: "What does the pain feel like?"  (e.g., sharp, dull, aching, burning)     "Shooting pain from my knee cap to my hip and down my leg, causing my foot to go numb" - R side  3. SEVERITY: "How bad is the pain?" "What does it keep you from doing?"   (Scale 1-10; or mild, moderate, severe)   -  MILD (1-3): doesn't interfere with normal activities    -  MODERATE (4-7): interferes with normal activities (e.g., work or school) or awakens from sleep, limping    -  SEVERE (8-10): excruciating pain, unable to do any normal activities, unable to walk     Pain is worse with driving, using the gas and brake -- 8/10 pain without ibuprofen, wearing a knee brace  4. ONSET:  "When did the pain start?" "Does it come and go, or is it there all the time?"     1 month  5. RECURRENT: "Have you had this pain before?" If Yes, ask: "When, and what happened then?"     no 6. SETTING: "Has there been any recent work, exercise or other activity that involved that part of the body?"      Fall  7. AGGRAVATING FACTORS: "What makes the knee pain worse?" (e.g., walking, climbing stairs, running)     Driving  8. ASSOCIATED SYMPTOMS: "Is there any swelling or redness of the knee?"     No  9. OTHER SYMPTOMS: "Do you have any other symptoms?" (e.g., chest pain, difficulty breathing, fever, calf pain)      Ibuprofen dulls the pain, but pain still keeps her up   Denies R calf swelling or leg swelling   Denies bruising  Protocols used: Knee Pain-A-AH

## 2024-01-10 NOTE — Telephone Encounter (Signed)
 Has an appointment to see Dr Tilmon Font 01/11/24

## 2024-01-11 ENCOUNTER — Encounter: Payer: Self-pay | Admitting: Family Medicine

## 2024-01-11 ENCOUNTER — Ambulatory Visit (INDEPENDENT_AMBULATORY_CARE_PROVIDER_SITE_OTHER): Admitting: Family Medicine

## 2024-01-11 VITALS — BP 136/80 | HR 90 | Temp 96.9°F | Ht 62.0 in | Wt 125.6 lb

## 2024-01-11 DIAGNOSIS — M2242 Chondromalacia patellae, left knee: Secondary | ICD-10-CM | POA: Diagnosis not present

## 2024-01-11 DIAGNOSIS — M2241 Chondromalacia patellae, right knee: Secondary | ICD-10-CM

## 2024-01-11 NOTE — Progress Notes (Signed)
 Established Patient Office Visit   Subjective:  Patient ID: Ashley Harding, female    DOB: 10-01-1965  Age: 58 y.o. MRN: [id number]  Chief Complaint  Patient presents with   Fall    Right knee pain after fall a month ago says she thought it would heal but its not left knee is hurting as well but not as bad    Fall Pertinent negatives include no abdominal pain, loss of consciousness or tingling.   Encounter Diagnoses  Name Primary?   Patella, chondromalacia, left Yes   Patella, chondromalacia, right    Presents with right greater than left anterior knee pain.  This came about after a fall about a month ago.  She tripped on a electrical cord in her garage and fell forward landing on both of her knees and outstretched hands.  Shoulders have been sore but are improving.  Denies locking or giving way in her knees.  History of a right knee injury in her distant past that has since healed.  No history of knee surgery.   Review of Systems  Constitutional: Negative.   HENT: Negative.    Eyes:  Negative for blurred vision, discharge and redness.  Respiratory: Negative.    Cardiovascular: Negative.   Gastrointestinal:  Negative for abdominal pain.  Genitourinary: Negative.   Musculoskeletal:  Positive for joint pain. Negative for myalgias.  Skin:  Negative for rash.  Neurological:  Negative for tingling, loss of consciousness and weakness.  Endo/Heme/Allergies:  Negative for polydipsia.     Current Outpatient Medications:    atorvastatin  (LIPITOR) 20 MG tablet, Take 1 tablet (20 mg total) by mouth daily., Disp: 90 tablet, Rfl: 1   famotidine  (PEPCID ) 20 MG tablet, Take 1 tablet (20 mg total) by mouth daily., Disp: 30 tablet, Rfl: 2   fluticasone  (FLONASE ) 50 MCG/ACT nasal spray, Place 1 spray into both nostrils daily., Disp: 16 g, Rfl: 2   fluticasone -salmeterol (ADVAIR) 250-50 MCG/ACT AEPB, Inhale 1 puff into the lungs in the morning and at bedtime., Disp: 60 each, Rfl: 2    omeprazole  (PRILOSEC) 20 MG capsule, Take 1 capsule (20 mg total) by mouth daily., Disp: 30 capsule, Rfl: 0   sertraline (ZOLOFT) 50 MG tablet, Take 50 mg by mouth daily., Disp: , Rfl:    risperiDONE (RISPERDAL) 1 MG tablet, Take 1 tablet by mouth 2 (two) times daily. (Patient not taking: Reported on 01/11/2024), Disp: , Rfl:    Vitamin D , Ergocalciferol , (DRISDOL ) 1.25 MG (50000 UNIT) CAPS capsule, Take 1 capsule (50,000 Units total) by mouth once a week. (Patient not taking: Reported on 02/24/2023), Disp: 5 capsule, Rfl: 0   Objective:     BP 136/80 (BP Location: Left Arm, Patient Position: Sitting, Cuff Size: Small)   Pulse 90   Temp (!) 96.9 F (36.1 C) (Temporal)   Ht 5\' 2"  (1.575 m)   Wt 125 lb 9.6 oz (57 kg)   SpO2 98%   BMI 22.97 kg/m    Physical Exam Constitutional:      General: She is not in acute distress.    Appearance: Normal appearance. She is not ill-appearing, toxic-appearing or diaphoretic.  HENT:     Head: Normocephalic and atraumatic.     Right Ear: External ear normal.     Left Ear: External ear normal.  Eyes:     General: No scleral icterus.       Right eye: No discharge.        Left eye: No discharge.  Extraocular Movements: Extraocular movements intact.     Conjunctiva/sclera: Conjunctivae normal.  Cardiovascular:     Pulses:          Dorsalis pedis pulses are 1+ on the right side and 1+ on the left side.       Posterior tibial pulses are 1+ on the right side and 1+ on the left side.  Pulmonary:     Effort: Pulmonary effort is normal. No respiratory distress.  Musculoskeletal:     Right hip: No bony tenderness. Normal range of motion. Normal strength.     Left hip: No bony tenderness. Normal range of motion. Normal strength.     Right knee: No swelling, effusion, ecchymosis or bony tenderness. Normal range of motion. No ACL laxity.     Instability Tests: Anterior drawer test negative. Posterior drawer test negative.     Left knee: No swelling,  effusion, ecchymosis or bony tenderness. Normal range of motion. No ACL laxity.    Instability Tests: Anterior drawer test negative. Posterior drawer test negative.     Right lower leg: No edema.     Left lower leg: No edema.       Legs:  Skin:    General: Skin is warm and dry.  Neurological:     Mental Status: She is alert and oriented to person, place, and time.  Psychiatric:        Mood and Affect: Mood normal.        Behavior: Behavior normal.      No results found for any visits on 01/11/24.    The 10-year ASCVD risk score (Arnett DK, et al., 2019) is: 4.3%    Assessment & Plan:   Patella, chondromalacia, left -     Ambulatory referral to Sports Medicine -     DG Knee Complete 4 Views Left; Future  Patella, chondromalacia, right -     Ambulatory referral to Sports Medicine -     DG Knee Complete 4 Views Right; Future    Return May continue with ibuprofen.  Consider giving Voltaren gel to try which is also over-the-counter.Tonna Frederic, MD

## 2024-01-12 ENCOUNTER — Ambulatory Visit
Admission: RE | Admit: 2024-01-12 | Discharge: 2024-01-12 | Disposition: A | Discharge status: Home / Self Care | Source: Ambulatory Visit | Attending: Family Medicine | Admitting: Family Medicine

## 2024-01-12 DIAGNOSIS — M25562 Pain in left knee: Secondary | ICD-10-CM | POA: Diagnosis not present

## 2024-01-12 DIAGNOSIS — M7989 Other specified soft tissue disorders: Secondary | ICD-10-CM | POA: Diagnosis not present

## 2024-01-12 DIAGNOSIS — M2242 Chondromalacia patellae, left knee: Secondary | ICD-10-CM | POA: Diagnosis not present

## 2024-01-12 DIAGNOSIS — M2241 Chondromalacia patellae, right knee: Secondary | ICD-10-CM

## 2024-01-17 ENCOUNTER — Ambulatory Visit: Payer: Self-pay | Admitting: Family Medicine

## 2024-01-25 DIAGNOSIS — G4733 Obstructive sleep apnea (adult) (pediatric): Secondary | ICD-10-CM | POA: Diagnosis not present

## 2024-02-01 ENCOUNTER — Telehealth: Payer: Self-pay | Admitting: Family Medicine

## 2024-02-01 NOTE — Telephone Encounter (Signed)
 Spoke with patient to schedule her AWV She stated she was moving and would no longer be a patient

## 2024-02-24 DIAGNOSIS — G4733 Obstructive sleep apnea (adult) (pediatric): Secondary | ICD-10-CM | POA: Diagnosis not present

## 2024-04-14 ENCOUNTER — Other Ambulatory Visit: Payer: Self-pay | Admitting: Family Medicine

## 2024-04-14 DIAGNOSIS — Z1231 Encounter for screening mammogram for malignant neoplasm of breast: Secondary | ICD-10-CM

## 2024-04-21 ENCOUNTER — Inpatient Hospital Stay: Admission: RE | Admit: 2024-04-21 | Payer: Medicare HMO | Source: Ambulatory Visit

## 2024-07-02 DIAGNOSIS — K5732 Diverticulitis of large intestine without perforation or abscess without bleeding: Secondary | ICD-10-CM | POA: Diagnosis not present

## 2024-07-02 DIAGNOSIS — K5900 Constipation, unspecified: Secondary | ICD-10-CM | POA: Diagnosis not present
